# Patient Record
Sex: Female | Born: 1975 | Race: White | Hispanic: No | Smoking: Never smoker
Health system: Southern US, Community
[De-identification: ages and names within clinical notes are randomized; demographics above are authoritative.]

## PROBLEM LIST (undated history)

## (undated) DIAGNOSIS — F329 Major depressive disorder, single episode, unspecified: Secondary | ICD-10-CM

## (undated) DIAGNOSIS — G4730 Sleep apnea, unspecified: Secondary | ICD-10-CM

## (undated) DIAGNOSIS — K219 Gastro-esophageal reflux disease without esophagitis: Secondary | ICD-10-CM

## (undated) DIAGNOSIS — IMO0002 Reserved for concepts with insufficient information to code with codable children: Secondary | ICD-10-CM

## (undated) DIAGNOSIS — F32A Depression, unspecified: Secondary | ICD-10-CM

## (undated) DIAGNOSIS — IMO0001 Reserved for inherently not codable concepts without codable children: Secondary | ICD-10-CM

## (undated) DIAGNOSIS — F319 Bipolar disorder, unspecified: Secondary | ICD-10-CM

## (undated) DIAGNOSIS — L98499 Non-pressure chronic ulcer of skin of other sites with unspecified severity: Secondary | ICD-10-CM

## (undated) HISTORY — PX: DILATION AND CURETTAGE OF UTERUS: SHX78

## (undated) HISTORY — PX: KIDNEY STONE SURGERY: SHX686

## (undated) HISTORY — PX: TUBAL LIGATION: SHX77

---

## 1998-12-11 ENCOUNTER — Emergency Department (HOSPITAL_COMMUNITY): Admission: EM | Admit: 1998-12-11 | Discharge: 1998-12-12 | Payer: Self-pay | Admitting: Emergency Medicine

## 1999-01-07 ENCOUNTER — Emergency Department (HOSPITAL_COMMUNITY): Admission: EM | Admit: 1999-01-07 | Discharge: 1999-01-07 | Payer: Self-pay | Admitting: Emergency Medicine

## 1999-01-07 ENCOUNTER — Encounter: Payer: Self-pay | Admitting: Emergency Medicine

## 1999-01-07 ENCOUNTER — Inpatient Hospital Stay (HOSPITAL_COMMUNITY): Admission: EM | Admit: 1999-01-07 | Discharge: 1999-01-08 | Payer: Self-pay | Admitting: Emergency Medicine

## 1999-01-29 ENCOUNTER — Emergency Department (HOSPITAL_COMMUNITY): Admission: EM | Admit: 1999-01-29 | Discharge: 1999-01-29 | Payer: Self-pay

## 1999-01-30 ENCOUNTER — Inpatient Hospital Stay (HOSPITAL_COMMUNITY): Admission: AD | Admit: 1999-01-30 | Discharge: 1999-01-30 | Payer: Self-pay | Admitting: Obstetrics and Gynecology

## 1999-07-05 ENCOUNTER — Other Ambulatory Visit: Admission: RE | Admit: 1999-07-05 | Discharge: 1999-07-05 | Payer: Self-pay | Admitting: *Deleted

## 1999-09-17 ENCOUNTER — Emergency Department (HOSPITAL_COMMUNITY): Admission: EM | Admit: 1999-09-17 | Discharge: 1999-09-17 | Payer: Self-pay | Admitting: Emergency Medicine

## 1999-11-24 ENCOUNTER — Emergency Department (HOSPITAL_COMMUNITY): Admission: EM | Admit: 1999-11-24 | Discharge: 1999-11-24 | Payer: Self-pay | Admitting: Emergency Medicine

## 2000-01-21 ENCOUNTER — Other Ambulatory Visit: Admission: RE | Admit: 2000-01-21 | Discharge: 2000-01-21 | Payer: Self-pay | Admitting: *Deleted

## 2000-02-14 ENCOUNTER — Ambulatory Visit (HOSPITAL_COMMUNITY): Admission: AD | Admit: 2000-02-14 | Discharge: 2000-02-14 | Payer: Self-pay | Admitting: Obstetrics and Gynecology

## 2000-02-14 ENCOUNTER — Encounter: Payer: Self-pay | Admitting: Obstetrics and Gynecology

## 2000-02-14 ENCOUNTER — Encounter (INDEPENDENT_AMBULATORY_CARE_PROVIDER_SITE_OTHER): Payer: Self-pay

## 2000-11-25 ENCOUNTER — Emergency Department (HOSPITAL_COMMUNITY): Admission: EM | Admit: 2000-11-25 | Discharge: 2000-11-25 | Payer: Self-pay

## 2001-02-24 ENCOUNTER — Encounter: Payer: Self-pay | Admitting: Emergency Medicine

## 2001-02-24 ENCOUNTER — Observation Stay (HOSPITAL_COMMUNITY): Admission: AD | Admit: 2001-02-24 | Discharge: 2001-02-25 | Payer: Self-pay | Admitting: Obstetrics and Gynecology

## 2001-02-25 ENCOUNTER — Encounter: Payer: Self-pay | Admitting: Obstetrics and Gynecology

## 2001-03-11 ENCOUNTER — Encounter: Payer: Self-pay | Admitting: Obstetrics and Gynecology

## 2001-03-11 ENCOUNTER — Ambulatory Visit (HOSPITAL_COMMUNITY): Admission: RE | Admit: 2001-03-11 | Discharge: 2001-03-11 | Payer: Self-pay | Admitting: Obstetrics and Gynecology

## 2001-03-13 ENCOUNTER — Ambulatory Visit (HOSPITAL_COMMUNITY): Admission: RE | Admit: 2001-03-13 | Discharge: 2001-03-13 | Payer: Self-pay | Admitting: Obstetrics and Gynecology

## 2001-03-13 ENCOUNTER — Encounter (INDEPENDENT_AMBULATORY_CARE_PROVIDER_SITE_OTHER): Payer: Self-pay | Admitting: Specialist

## 2001-08-28 ENCOUNTER — Emergency Department (HOSPITAL_COMMUNITY): Admission: EM | Admit: 2001-08-28 | Discharge: 2001-08-28 | Payer: Self-pay

## 2008-12-01 ENCOUNTER — Emergency Department (HOSPITAL_COMMUNITY): Admission: EM | Admit: 2008-12-01 | Discharge: 2008-12-01 | Payer: Self-pay | Admitting: Emergency Medicine

## 2010-05-06 ENCOUNTER — Emergency Department (HOSPITAL_COMMUNITY)
Admission: EM | Admit: 2010-05-06 | Discharge: 2010-05-06 | Payer: Self-pay | Source: Home / Self Care | Admitting: Emergency Medicine

## 2010-07-22 LAB — COMPREHENSIVE METABOLIC PANEL
AST: 20 U/L (ref 0–37)
Albumin: 3.5 g/dL (ref 3.5–5.2)
Alkaline Phosphatase: 53 U/L (ref 39–117)
Chloride: 108 mEq/L (ref 96–112)
GFR calc Af Amer: >60 mL/min (ref >60)
Potassium: 3.9 mEq/L (ref 3.5–5.1)
Sodium: 138 mEq/L (ref 135–145)
Total Bilirubin: 0.3 mg/dL (ref 0.3–1.2)
Total Protein: 6.7 g/dL (ref 6.0–8.3)

## 2010-07-22 LAB — URINALYSIS, ROUTINE W REFLEX MICROSCOPIC
Hgb urine dipstick: NEGATIVE
Nitrite: NEGATIVE
Protein, ur: NEGATIVE mg/dL
Specific Gravity, Urine: 1.017 (ref 1.005–1.030)
Urobilinogen, UA: 0.2 mg/dL (ref 0.0–1.0)

## 2010-07-22 LAB — CBC
Platelets: 225 10*3/uL (ref 150–400)
RBC: 4.56 MIL/uL (ref 3.87–5.11)
RDW: 13.7 % (ref 11.5–15.5)
WBC: 14.1 10*3/uL — ABNORMAL HIGH (ref 4.0–10.5)

## 2010-07-22 LAB — DIFFERENTIAL
Basophils Absolute: 0 10*3/uL (ref 0.0–0.1)
Basophils Relative: 0 % (ref 0–1)
Eosinophils Relative: 1 % (ref 0–5)
Monocytes Absolute: 1.1 10*3/uL — ABNORMAL HIGH (ref 0.1–1.0)
Monocytes Relative: 8 % (ref 3–12)
Neutro Abs: 11.1 10*3/uL — ABNORMAL HIGH (ref 1.7–7.7)

## 2010-08-18 LAB — DIFFERENTIAL
Basophils Absolute: 0.2 10*3/uL — ABNORMAL HIGH (ref 0.0–0.1)
Lymphocytes Relative: 5 % — ABNORMAL LOW (ref 12–46)
Lymphs Abs: 0.7 10*3/uL (ref 0.7–4.0)
Neutro Abs: 10.6 10*3/uL — ABNORMAL HIGH (ref 1.7–7.7)

## 2010-08-18 LAB — URINALYSIS, ROUTINE W REFLEX MICROSCOPIC
Hgb urine dipstick: NEGATIVE
Leukocytes, UA: NEGATIVE
Nitrite: NEGATIVE
Protein, ur: 30 mg/dL — AB
Specific Gravity, Urine: 1.027 (ref 1.005–1.030)
Urobilinogen, UA: 1 mg/dL (ref 0.0–1.0)

## 2010-08-18 LAB — CBC
HCT: 42 % (ref 36.0–46.0)
MCHC: 33.4 g/dL (ref 30.0–36.0)
MCV: 82.1 fL (ref 78.0–100.0)
Platelets: 196 10*3/uL (ref 150–400)
RBC: 5.12 MIL/uL — ABNORMAL HIGH (ref 3.87–5.11)
WBC: 13 10*3/uL — ABNORMAL HIGH (ref 4.0–10.5)

## 2010-08-18 LAB — COMPREHENSIVE METABOLIC PANEL
BUN: 16 mg/dL (ref 6–23)
CO2: 23 mEq/L (ref 19–32)
Calcium: 9.1 mg/dL (ref 8.4–10.5)
Chloride: 104 mEq/L (ref 96–112)
Creatinine, Ser: 0.79 mg/dL (ref 0.4–1.2)
GFR calc Af Amer: >60 mL/min (ref >60)
GFR calc non Af Amer: >60 mL/min (ref >60)
Total Bilirubin: 0.7 mg/dL (ref 0.3–1.2)

## 2010-08-18 LAB — URINE MICROSCOPIC-ADD ON

## 2010-08-18 LAB — PREGNANCY, URINE: Preg Test, Ur: NEGATIVE

## 2010-08-18 LAB — LIPASE, BLOOD: Lipase: 14 U/L (ref 11–59)

## 2010-09-27 NOTE — H&P (Signed)
[facility]  Patient:    Barbara Andersen, Barbara Andersen Visit Number: [id number] MRN: [id number]          Service Type: OBS Location: 9300 [id number] Attending Physician:  Leonard Schwartz Dictated by:   Janine Limbo, M.D. Admit Date:  02/24/2001                           History and Physical  HISTORY OF PRESENT ILLNESS:   Barbara Andersen is a 35 year old female, gravida 3, para 0-0-2-0, who presented to the emergency department at [facility] on February 24, 2001 complaining of abdominal pain.  She had a four-hour period on February 23, 2001.  She has had two prior miscarriages. She had a D&C in 2001 because of a miscarriage.  The patient reports that she has had nausea.  She also has had diarrhea 30 minutes after meals for the past two months.  She has otherwise been without complaints.  She denies fever, vomiting and GU complaints.  OBSTETRICAL HISTORY:          The patient had a miscarriage in 2000 and another miscarriage in 2001.  She had a D&C following her miscarriage in 2001.  PAST MEDICAL HISTORY:         The patient denies hypertension and diabetes.  DRUG ALLERGIES:               None known.  SOCIAL HISTORY:               The patient denies cigarette use, alcohol use and recreational drug use.  REVIEW OF SYSTEMS:            Please see history of present illness.  FAMILY HISTORY:               The patients father had hypertension.  PHYSICAL EXAMINATION:  VITAL SIGNS:                  Temperature is 97.2.  Respirations 20.  Pulse is 100.  Blood pressure is 114/74.  HEENT:                        Within normal limits.  CHEST:                        Clear.  HEART:                        Regular rate and rhythm.  ABDOMEN:                      Soft and nontender.  EXTREMITIES:                  Within normal limits.  NEUROLOGIC:                   Grossly normal.  PELVIC:                       External genitalia are normal.  The  vagina is nontender.  No blood is present.  The cervix is closed and nontender.  Uterus is normal size, shape and consistency, nontender.  Adnexa:  No masses.  LABORATORY VALUES:            Blood type is B-positive.  Quantitative hCG is 15,595.  An ultrasound  was performed that showed a 7 weeks missed abortion.  There was no blood flow to the left adnexa and there were small cysts present.  There was a question of torsion of the left ovary.  ASSESSMENT:                   1. Seven weeks gestation.                               2. Missed abortion.                               3. Questionable torsion of the left ovary.  PLAN:                         A long discussion was held with the patient and her mother.  We discussed our options for management which included taking the patient to the operating room tonight and performing a dilatation and evacuation.  We would also perform a diagnostic laparoscopy to document the status of the left ovary.  We also discussed the option of observation tonight with repeat ultrasound tomorrow to see if we felt that a laparoscopy was entirely necessary.  We discussed observation in the hospital but we also discussed having the procedures performed as an outpatient.  We discussed the benefits and risks of each of those options including a risk that if there was indeed a torsion to the left ovary that we would still need to operate on her later on tonight and that there still may be some permanent damage to the left ovary which may result in the need for a left oophorectomy.  The patient considered all of the options and she has elected to proceed to [facility] for observation tonight and then repeat ultrasound in the morning in hopes of avoiding laparoscopy. Dictated by:   Janine Limbo, M.D. Attending Physician:  Leonard Schwartz DD:  02/24/01 TD:  02/25/01 Job: [id number]

## 2010-09-27 NOTE — H&P (Signed)
[facility]  Patient:    MADHAVI, HAMBLEN Visit Number: [id number] MRN: [id number]          Service Type: EMS Location: ED Attending Physician:  Hanley Seamen Dictated by:   Janine Limbo, M.D. Admit Date:  02/24/2001 Discharge Date: 02/24/2001                           History and Physical  HISTORY OF PRESENT ILLNESS:   [name] is a 35 year old female, gravida 3, para 0-0-2-0, who presents for dilatation and evacuation because of a first-trinester missed abortion.  Ultrasound was obtained and confirmed the above.  OBSTETRICAL HISTORY:          The patient had a miscarriage in 2000 and another miscarriage in 2001.  She had a D&C following her miscarriage in 2001.   PAST MEDICAL HISTORY:         The patient denies hypertension and diabetes.  DRUG ALLERGIES:               None known.  SOCIAL HISTORY:               The patient denies cigarette use, alcohol use, and recreational drug use.  REVIEW OF SYSTEMS:            Noncontributory.  FAMILY HISTORY:               The patients father had hypertension.  PHYSICAL EXAMINATION:  VITAL SIGNS:                  Weight is 258 pounds.  HEENT:                        Within normal limits.  CHEST:                        Clear.  HEART:                        Regular rate and rhythm.  BREASTS:                      Without masses.  ABDOMEN:                      Nontender.  EXTREMITIES:                  Within normal limits.  NEUROLOGIC:                   Exam is grossly normal.  PELVIC:                       Cervix is closed and long.  Uterus is normal size, shape, and consistency.  Adnexa - no masses.  LABORATORY VALUES:            Blood type is B positive.  HCG on February 24, 2001, was 15,595.  ASSESSMENT:                   First-trimester missed abortion.  PLAN:                         The patient will undergo a suction, dilatation, and evacuation.  The patient understands  the indications for her procedure and she  accepts the risks of, but not limited to, anesthetic complications, bleeding, infections, and possible damage to the surrounding organs. Dictated by:   Janine Limbo, M.D. Attending Physician:  Hanley Seamen DD:  03/12/01 TD:  03/12/01 Job: [id number]

## 2010-09-27 NOTE — Op Note (Signed)
[facility]  Patient:    Barbara, Andersen Visit Number: [id number] MRN: [id number]          Service Type: DSU Location: [facility] Attending Physician:  Leonard Schwartz Proc. Date: 03/13/01 Admit Date:  03/13/2001 Discharge Date: 03/13/2001                             Operative Report  PREOPERATIVE DIAGNOSIS:       First trimester missed abortion.  POSTOPERATIVE DIAGNOSIS:      First trimester missed abortion.  OPERATION:                    Dilatation and evacuation.  SURGEON:                      Janine Limbo, M.D.  ASSISTANT:  ANESTHESIA:                   Monitored anesthesia care and paracervical block using 0.5% Marcaine.  ESTIMATED BLOOD LOSS:  INDICATIONS:                  [name] is a 35 year old female, gravida 3, para 0-0-2-0, who presents with a missed abortion in the first trimester that was confirmed by ultrasound.  The patient understands the indications for her procedure and she accepts the risks of, but not limited to, anesthetic complications, bleeding, infections, and possible damage to the surrounding organs.  FINDINGS:                     The patients blood type is B positive.  A small amount of products of conception were removed from within the uterine cavity.  DESCRIPTION OF PROCEDURE:     The patient was taken to the operating room where she was given medication through her IV line.  The perineum and vagina were prepped with multiple layers of Betadine.  The bladder was drained of urine. Examination under anesthesia was performed.  The patient was sterilely draped.  A paracervical block was placed using 10 cc of 0.5% Marcaine. The uterus was sounded to 11 cm.  The cervix was gradually dilated.  The uterine cavity was evacuated using a #10 suction curet and then a medium sharp curet. The cavity was felt to be clean at the end of our procedure.  The postoperative uterus was firm.  Sponge, needle, and  instrument counts were correct x 2 occasions.  The estimated blood loss was 20 cc.  The patient tolerated the procedure well.  She was taken to the recovery room in stable condition.  FOLLOW-UP:                    The patient was given prescription for Vicodin one to two every four hours as needed for pain 20 tablets no refills.  She was given a copy of the postoperative instruction sheet as prepared by [facility] for patients who have undergone a D&C.  She will return to see Dr. Stefano Gaul in two to three weeks for follow-up examination.  She will call for questions or concerns. Attending Physician:  Leonard Schwartz DD:  03/13/01 TD:  03/15/01 Job: [id number]

## 2010-09-27 NOTE — H&P (Signed)
[facility]  Patient:    Barbara Andersen, Barbara Andersen                     MRN: [id number] Adm. Date:  [id number] Attending:  Shaune Spittle Dictator:   Nigel Bridgeman, C.N.M.                         History and Physical  REDICTATION  HISTORY OF PRESENT ILLNESS:   Ms. Barbara Andersen is a 35 year old, gravida 2, para 0-0-1-0 who presented to maternity admissions today with known SAB and onset of heavy bleeding and cramping. The patient was seen on September 25 for a routine OB visit. At that time, no fetal heart tones were noted at approximately 10-[redacted] week gestation. Ultrasound was done that showed a 6 week, 3 day empty gestational sac. Quantitative hcg at that time was approximately 10,000. Subsequent quantitative 2 days later was approximately 7800 and on October 4 quantitative hcg was 2058. The patient denies passing any products of conception on the way to the hospital.  PRENATAL LABS:                Blood type is B+ Rh antibody negative. VDRL nonreactive. Rubella titer positive. Hepatitis B surface antigen negative. GC and chlamydia cultures were negative in the first trimester. Hemoglobin today is 13.9, hematocrit of 40.3, wbc count of 12.2 and platelets of 232.  OBSTETRICAL HISTORY:          The patient entered care at approximately [redacted] week gestation. She had had a small amount of spotting but findings were normal. She was seen 2 weeks later for a fetal heart rate check secondary to inability to hear it at the first visit with large amount of adipose tissue. Ultrasound was done at that time which documented the empty gestational sac. The patient elected to follow this expectantly and her quants were falling appropriately until the onset of her bleeding and cramping today.  GYNECOLOGIC HISTORY:          Her last menstrual period was November 18, 1999. She had a history of ovarian cysts.  MEDICAL HISTORY:              She does have a history of obesity and  large body habitus with height of 5 foot 10 and weight of 245. She has had no previous surgeries or hospitalizations. She is allergic to seafood but has no known medication allergies.  FAMILY HISTORY:               Unremarkable.  SOCIAL HISTORY:               The patient is single, the partner is involved. The patient is employed at [employer]. She denies any alcohol, drug or tobacco use during this pregnancy.  PHYSICAL EXAMINATION:  VITAL SIGNS:                  Stable. The patient is afebrile.  HEENT:                        Within normal limits.  LUNGS:                        Bilateral breath sounds are clear.  HEART:                        Regular rate  and rhythm without murmur.  BREASTS:                      Soft and nontender.  ABDOMEN:                      Soft, slightly tender in the lower segment. Positive bowel sounds are noted.  PELVIC:                       There is a moderate amount of dark blood and tissue in the vault. There was a moderate amount of gestational tissue at the cervical os that was removed during the exam. Subsequently her bleeding has been minimal. The cervix was slightly tender to palpation. The uterus was slightly tender as well.  EXTREMITIES:                  Deep tendon reflexes were 2+ without clonus. There is a trace edema noted.  Ultrasound in maternity admissions today revealed probable retained products of conception.  IMPRESSION:                   1. Incomplete SAB.                               2. Rh positive.  PLAN:                         1. Consulted with Dr. Pennie Rushing regarding patient                                 management. Dr. Pennie Rushing did see the patient                               and repeated a pelvic exam prior to the                         patient going to surgery.                               2. Dr. Pennie Rushing believes that D&E is indicated.                                The patient consented to this   procedure.                               3. The patient will be taken to the operating                               room at direction of timing regarding the OR. DD: 02/14/00 TD:  02/14/00 Job: [id number]

## 2010-09-27 NOTE — H&P (Signed)
[facility]  Patient:    Barbara Andersen, Barbara Andersen                     MRN: [id number] Adm. Date:  [id number] Attending:  Shaune Spittle Dictator:   Nigel Bridgeman, C.N.M.                         History and Physical  HISTORY OF PRESENT ILLNESS:       Ms. Barbara Andersen is a 35 year old gravida 2, para 0-0-1-0 who presented to maternity admissions today with onset of heavy vaginal bleeding and severe cramping. Her history has been remarkable for documented empty gestational sac noted by ultrasound on September 25. She has been followed with gradually decreasing quantitative hcg levels. Quantitative hcg on September 25 was 10,911.6 repeat was 7825.7 on September 27 and 2058.7 on October 4. She had had sporadic small amount of bleeding since the diagnosis of SAB but this morning was the first onset of heavy bleeding. When she presented to maternity admission, she had not passed any products of conception but on evaluation by pelvic exam, she was found to have a moderate amount of products of conception at the cervical os. This was removed. The patients cramping and bleeding did decrease subsequent to this. She was sent for ultrasound and a finding consistent with retained products of conception was noted on ultrasound therefore the patient had already been seen by Dr. Pennie Rushing and the plan was made to proceed with B&E for retained products. The patients bleeding was minimal and her cramping was also minimal at that time.  MEDICAL HISTORY:              Remarkable for obesity and large body habitus with height of 5 foot 10 and weight of 245.  GYNECOLOGICAL HISTORY:        Her last menstrual period was November 12, 1999. Her periods have been normal. She does have a history of ovarian cysts in the past.  PAST SURGICAL HISTORY:        None.  PRENATAL COURSE:              The patient entered care at approximately 10 weeks.  Due to large body habitus, there was  inability to hear fetal heart tones. The patient was seen approximately 1 to 2 weeks later for reevaluation. At that time, still no fetal heart tones were noted and an ultrasound was done at that time that documented a 6 week 3 day empty gestational sac.  PRENATAL LABS:                Blood type is B+ Rh antibody negative. VDRL nonreactive. Rubella titer positive. Hepatitis B surface antigen negative. Hemoglobin today was 13.9, wbc was 12.2, hematocrit 40.3, platelets were 232.  PHYSICAL EXAMINATION:  VITAL SIGNS:                  Stable. The patient is afebrile.  HEENT:                        Within normal limits.  LUNGS:                        Clear.  HEART:                        Regular rate and rhythm without murmur.  BREASTS:                      Soft and nontender.  ABDOMEN:                      Fundal height approximately [redacted] week gestational size but difficult to assess secondary to adipose tissue. The uterus is slightly tender to palpation.  PELVIC:                       On admission, there was a moderate amount of bleeding noted. There were products of conception noted at the os. These were removed and bleeding did decrease subsequently. Bleeding is now a small amount. The pelvic exam was verified by Dr. Pennie Rushing.  EXTREMITIES:                  Deep tendon reflexes are 2+ without clonus. There is a trace edema noted.  IMPRESSION:                   1. Incomplete AB.                               2. Rh positive blood type.                               3. Stable in light of bleeding and pain at this                                  time.  PLAN:                         1. Consulted with Dr. Pennie Rushing as attending                                  physician.                               2. Plan made for dilatation and evacuation.                               3. Risks and benefits were reviewed for the                             procedure with Dr. Pennie Rushing and the  patient.                           The patient did consent to this.                               4. Anticipate discharge after D&E. DD:  02/14/00 TD:  02/14/00 Job: [id number]

## 2010-09-27 NOTE — Op Note (Signed)
[facility]  Patient:    Barbara Andersen, Barbara Andersen                     MRN: [id number] Proc. Date: 02/14/00 Adm. Date:  [id number] Attending:  Dierdre Forth Pearline                           Operative Report  PREOPERATIVE DIAGNOSIS:       Incomplete spontaneous abortion.  POSTOPERATIVE DIAGNOSIS:      Incomplete spontaneous abortion.  OPERATION:                    Suction, dilatation, and evacuation.  ANESTHESIA:                   Monitored anesthesia care and local.  ESTIMATED BLOOD LOSS:         Less than 50 cc.  COMPLICATIONS:                None.  FINDING:                      A small amount of products of conception were obtained on D&E.  DESCRIPTION OF PROCEDURE:     The patient had been seen in maternity admissions area this morning, presenting complaining of increasing abdominal pain with what was known to be a missed abortion.  The patient, during her evaluation in MAU, passed a large amount of tissue, and her bleeding decreased significantly as did her pain.  An ultrasound was performed which showed remaining products of conception.  She was brought to the operating room after discussion was held with her and her partner concerning the risks of anesthesia, bleeding, infection, and perforation of the uterus.  They wished to proceed with D&E.  The patient was taken to the operating room and placed on the operating table.  She was placed in the modified lithotomy position. Perineum and vagina were prepped with multiple layers of Hibiclens and draped. The speculum was placed in the vagina and a single-tooth tenaculum placed on the cervix.  A paracervical block was achieved with a total of 10 cc of 2% Xylocaine.  The cervix was noted to be opening up to accommodate a #8 suction curette after passing several dilators.  The suction curette was used to evacuate all tissue from the uterus and a sharp curette used to ensure that all tissue had been  removed.  Hemostasis was noted to be adequate.  All instruments were removed from the vagina and the patient taken from the operating room to the recovery room in satisfactory condition, having tolerated the procedure well with sponge and instrument counts correct.  DISCHARGE INSTRUCTIONS:       Printed instructions from [facility].  DISCHARGE MEDICATIONS:        1. Doxycycline 100 mg p.o. b.i.d. for 7 days.                               2. Methergine 0.2 mg p.o. q.i.d. x 2 days.                               3. Ibuprofen 600 mg p.o. q.6h. p.r.n. pain.  FOLLOW-UP:  The patient is to follow up in the office in two weeks. DD:  02/14/00 TD:  02/15/00 Job: [id number]

## 2011-03-11 ENCOUNTER — Emergency Department (HOSPITAL_COMMUNITY)
Admission: EM | Admit: 2011-03-11 | Discharge: 2011-03-11 | Disposition: A | Discharge status: Home / Self Care | Payer: Medicaid - Out of State | Attending: Emergency Medicine | Admitting: Emergency Medicine

## 2011-03-11 DIAGNOSIS — M549 Dorsalgia, unspecified: Secondary | ICD-10-CM | POA: Insufficient documentation

## 2011-03-11 DIAGNOSIS — Z79899 Other long term (current) drug therapy: Secondary | ICD-10-CM | POA: Insufficient documentation

## 2011-03-16 ENCOUNTER — Encounter: Payer: Self-pay | Admitting: *Deleted

## 2011-03-16 ENCOUNTER — Emergency Department (HOSPITAL_COMMUNITY)
Admission: EM | Admit: 2011-03-16 | Discharge: 2011-03-16 | Disposition: A | Discharge status: Home / Self Care | Payer: Medicaid - Out of State | Attending: Emergency Medicine | Admitting: Emergency Medicine

## 2011-03-16 ENCOUNTER — Emergency Department (HOSPITAL_COMMUNITY): Payer: Medicaid - Out of State

## 2011-03-16 DIAGNOSIS — M545 Low back pain, unspecified: Secondary | ICD-10-CM | POA: Insufficient documentation

## 2011-03-16 DIAGNOSIS — M549 Dorsalgia, unspecified: Secondary | ICD-10-CM

## 2011-03-16 DIAGNOSIS — R209 Unspecified disturbances of skin sensation: Secondary | ICD-10-CM | POA: Insufficient documentation

## 2011-03-16 DIAGNOSIS — IMO0002 Reserved for concepts with insufficient information to code with codable children: Secondary | ICD-10-CM | POA: Insufficient documentation

## 2011-03-16 DIAGNOSIS — M79609 Pain in unspecified limb: Secondary | ICD-10-CM | POA: Insufficient documentation

## 2011-03-16 DIAGNOSIS — K219 Gastro-esophageal reflux disease without esophagitis: Secondary | ICD-10-CM | POA: Insufficient documentation

## 2011-03-16 DIAGNOSIS — F3130 Bipolar disorder, current episode depressed, mild or moderate severity, unspecified: Secondary | ICD-10-CM | POA: Insufficient documentation

## 2011-03-16 DIAGNOSIS — Z79899 Other long term (current) drug therapy: Secondary | ICD-10-CM | POA: Insufficient documentation

## 2011-03-16 HISTORY — DX: Reserved for inherently not codable concepts without codable children: IMO0001

## 2011-03-16 HISTORY — DX: Bipolar disorder, unspecified: F31.9

## 2011-03-16 HISTORY — DX: Gastro-esophageal reflux disease without esophagitis: K21.9

## 2011-03-16 HISTORY — DX: Reserved for concepts with insufficient information to code with codable children: IMO0002

## 2011-03-16 HISTORY — DX: Depression, unspecified: F32.A

## 2011-03-16 HISTORY — DX: Major depressive disorder, single episode, unspecified: F32.9

## 2011-03-16 HISTORY — DX: Sleep apnea, unspecified: G47.30

## 2011-03-16 HISTORY — DX: Non-pressure chronic ulcer of skin of other sites with unspecified severity: L98.499

## 2011-03-16 MED ORDER — HYDROMORPHONE HCL PF 1 MG/ML IJ SOLN
INTRAMUSCULAR | Status: AC
  Administered 2011-03-16: 18:00:00
  Filled 2011-03-16: qty 1

## 2011-03-16 MED ORDER — IBUPROFEN 800 MG PO TABS: 800.0000 mg | ORAL_TABLET | Freq: Three times a day (TID) | ORAL | Status: DC

## 2011-03-16 MED ORDER — HYDROMORPHONE HCL PF 1 MG/ML IJ SOLN
INTRAMUSCULAR | Status: AC
  Filled 2011-03-16: qty 1

## 2011-03-16 MED ORDER — HYDROMORPHONE HCL PF 1 MG/ML IJ SOLN
1.0000 mg | Freq: Once | INTRAMUSCULAR | Status: AC
  Administered 2011-03-16: 1 mg via INTRAMUSCULAR

## 2011-03-16 MED ORDER — OXYCODONE-ACETAMINOPHEN 5-325 MG PO TABS
1.0000 | ORAL_TABLET | ORAL | Status: AC | PRN
Start: 2011-03-16 — End: 2011-03-26

## 2011-03-16 MED ORDER — KETOROLAC TROMETHAMINE 60 MG/2ML IM SOLN
INTRAMUSCULAR | Status: AC
  Filled 2011-03-16: qty 2

## 2011-03-16 MED ORDER — HYDROMORPHONE HCL PF 1 MG/ML IJ SOLN: 1.0000 mg | Freq: Once | INTRAMUSCULAR | Status: DC

## 2011-03-16 MED ORDER — IBUPROFEN 800 MG PO TABS: 800.0000 mg | ORAL_TABLET | Freq: Three times a day (TID) | ORAL | Status: AC

## 2011-03-16 MED ORDER — KETOROLAC TROMETHAMINE 60 MG/2ML IM SOLN
60.0000 mg | Freq: Once | INTRAMUSCULAR | Status: AC
  Administered 2011-03-16: 60 mg via INTRAMUSCULAR

## 2011-03-16 NOTE — ED Notes (Signed)
This nurse covering Kim Kramm, RN for lunch. 

## 2011-03-16 NOTE — ED Provider Notes (Signed)
History     CSN: [id number] Arrival date & time: 03/16/2011 12:49 PM   First MD Initiated Contact with Patient 03/16/11 1356      Chief Complaint  Patient presents with  . Leg Pain    (Consider location/radiation/quality/duration/timing/severity/associated sxs/prior treatment) HPI History provided by patient.  Pt has had pain mid-line low back for the past 2 months.  Has worsened in last 2 weeks.  Radiates down both legs to the feet and associated w/ tingling.  Pain aggravated by bending, walking and hitting bumps while driving.  Denies fever, urinary symptoms, bowel retention and LE weakness.  No prior h/o back pain.  Per prior chart, pt seen for same in ED on 03/11/11.    Past Medical History  Diagnosis Date  . Bipolar 1 disorder   . Depression   . GERD (gastroesophageal reflux disease)   . Sleep apnea   . Disc degeneration   . Ulcer disease     Past Surgical History  Procedure Date  . Dilation and curettage of uterus   . Tubal ligation     No family history on file.  History  Substance Use Topics  . Smoking status: Never Smoker   . Smokeless tobacco: Not on file  . Alcohol Use: No    OB History    Grav Para Term Preterm Abortions TAB SAB Ect Mult Living                  Review of Systems  Allergies  Review of patient's allergies indicates no known allergies.  Home Medications   Current Outpatient Rx  Name Route Sig Dispense Refill  . AMPHETAMINE-DEXTROAMPHETAMINE 20 MG PO TABS Oral Take 20 mg by mouth 2 (two) times daily.      Marland Kitchen GABAPENTIN 100 MG PO CAPS Oral Take 100 mg by mouth 2 (two) times daily.      Carma Leaven M PLUS PO TABS Oral Take 1 tablet by mouth daily.      Marland Kitchen NAPROXEN 500 MG PO TABS Oral Take 500 mg by mouth daily.      . SERTRALINE HCL 25 MG PO TABS Oral Take 25 mg by mouth daily.        BP 124/76  Pulse 73  Temp(Src) 98.8 F (37.1 C) (Oral)  Resp 20  SpO2 98%  Physical Exam  ED Course  Procedures (including critical care  time)  Labs Reviewed - No data to display No results found.   No diagnosis found.    MDM  Medical screening examination/treatment/procedure(s) were performed by non-physician practitioner and as supervising physician I was immediately available for consultation/collaboration.        Jasmine Awe, MD 03/16/11 1537

## 2011-03-16 NOTE — ED Notes (Signed)
Pt reports having internal hemorrhoids since birth of child 7 yrs ago. States they have gotten worse. Has noticed blood in stool from them. Pt also reports having numbness and sharp, shooting pain down both legs, right greater than left for 2-3 wks. Pt does not have a PCP, and came to ER for further eval. Daughter at the bedside

## 2011-03-16 NOTE — ED Notes (Signed)
Pt states she is having pain in her butt with pain going down legs, pain increased over the last 2  Weeks, c/o hemorrhoids as well

## 2012-11-01 ENCOUNTER — Encounter (HOSPITAL_COMMUNITY): Payer: Self-pay | Admitting: Emergency Medicine

## 2012-11-01 ENCOUNTER — Emergency Department (HOSPITAL_COMMUNITY): Payer: Medicaid Other

## 2012-11-01 ENCOUNTER — Emergency Department (HOSPITAL_COMMUNITY)
Admission: EM | Admit: 2012-11-01 | Discharge: 2012-11-01 | Disposition: A | Discharge status: Home / Self Care | Payer: Medicaid Other | Attending: Emergency Medicine | Admitting: Emergency Medicine

## 2012-11-01 DIAGNOSIS — S99919A Unspecified injury of unspecified ankle, initial encounter: Secondary | ICD-10-CM | POA: Insufficient documentation

## 2012-11-01 DIAGNOSIS — S82899A Other fracture of unspecified lower leg, initial encounter for closed fracture: Secondary | ICD-10-CM | POA: Insufficient documentation

## 2012-11-01 DIAGNOSIS — W1692XA Jumping or diving into unspecified water causing other injury, initial encounter: Secondary | ICD-10-CM | POA: Insufficient documentation

## 2012-11-01 DIAGNOSIS — S8990XA Unspecified injury of unspecified lower leg, initial encounter: Secondary | ICD-10-CM | POA: Insufficient documentation

## 2012-11-01 DIAGNOSIS — E669 Obesity, unspecified: Secondary | ICD-10-CM | POA: Insufficient documentation

## 2012-11-01 DIAGNOSIS — S99912A Unspecified injury of left ankle, initial encounter: Secondary | ICD-10-CM

## 2012-11-01 DIAGNOSIS — K219 Gastro-esophageal reflux disease without esophagitis: Secondary | ICD-10-CM | POA: Insufficient documentation

## 2012-11-01 DIAGNOSIS — Z8669 Personal history of other diseases of the nervous system and sense organs: Secondary | ICD-10-CM | POA: Insufficient documentation

## 2012-11-01 DIAGNOSIS — Z79899 Other long term (current) drug therapy: Secondary | ICD-10-CM | POA: Insufficient documentation

## 2012-11-01 DIAGNOSIS — F319 Bipolar disorder, unspecified: Secondary | ICD-10-CM | POA: Insufficient documentation

## 2012-11-01 DIAGNOSIS — Z791 Long term (current) use of non-steroidal anti-inflammatories (NSAID): Secondary | ICD-10-CM | POA: Insufficient documentation

## 2012-11-01 DIAGNOSIS — Z8781 Personal history of (healed) traumatic fracture: Secondary | ICD-10-CM | POA: Insufficient documentation

## 2012-11-01 DIAGNOSIS — Y9339 Activity, other involving climbing, rappelling and jumping off: Secondary | ICD-10-CM | POA: Insufficient documentation

## 2012-11-01 DIAGNOSIS — S82839A Other fracture of upper and lower end of unspecified fibula, initial encounter for closed fracture: Secondary | ICD-10-CM

## 2012-11-01 DIAGNOSIS — Z8739 Personal history of other diseases of the musculoskeletal system and connective tissue: Secondary | ICD-10-CM | POA: Insufficient documentation

## 2012-11-01 DIAGNOSIS — Y9289 Other specified places as the place of occurrence of the external cause: Secondary | ICD-10-CM | POA: Insufficient documentation

## 2012-11-01 DIAGNOSIS — Z872 Personal history of diseases of the skin and subcutaneous tissue: Secondary | ICD-10-CM | POA: Insufficient documentation

## 2012-11-01 MED ORDER — HYDROMORPHONE HCL PF 1 MG/ML IJ SOLN
1.0000 mg | Freq: Once | INTRAMUSCULAR | Status: AC
  Administered 2012-11-01: 1 mg via INTRAVENOUS
  Filled 2012-11-01: qty 1

## 2012-11-01 MED ORDER — OXYCODONE-ACETAMINOPHEN 5-325 MG PO TABS: 1.0000 | ORAL_TABLET | Freq: Four times a day (QID) | ORAL | Status: DC | PRN

## 2012-11-01 NOTE — ED Notes (Signed)
[id number]<AV> Expected date:<BR> Expected time:<BR> Means of arrival:<BR> Comments:<BR> Pt still in room.

## 2012-11-01 NOTE — Progress Notes (Signed)
   CARE MANAGEMENT ED NOTE 11/01/2012  Patient:  Barbara Andersen,Barbara Andersen   Account Number:  [id number]  Date Initiated:  11/01/2012  Documentation initiated by:  Radford Pax  Subjective/Objective Assessment:   Patient presented to ED with swelling and pain to left ankle.     Subjective/Objective Assessment Detail:     Action/Plan:   Action/Plan Detail:   Anticipated DC Date:       Status Recommendation to Physician:   Result of Recommendation:    Other ED Services  Consult Working Plan    DC Planning Services  PCP issues    Choice offered to / List presented to:            Status of service:  Completed, signed off  ED Comments:   ED Comments Detail:  Patient listed as not having a pcp.  EDCM spoke to patient who stated she goes to [facility].  She stated that she cannot pronounce the name of her physician there.  EDCM also noted her Medicaid is out of state. Patient stated that she has since called DSS to correct that.  Patient used to live in [place].  No further needs at this time.

## 2012-11-01 NOTE — ED Notes (Signed)
Pt was at [place] and stepped into a pool and felt pain to lt ankle and now has swelling and pain not able to apply pressure. Pt has has fentayl pta 20g iv in places, strong pedal pulses.

## 2012-11-01 NOTE — ED Provider Notes (Signed)
History    CSN: [id number] Arrival date & time 11/01/12  1801  First MD Initiated Contact with Patient 11/01/12 1809     Chief Complaint  Patient presents with  . Ankle Pain   (Consider location/radiation/quality/duration/timing/severity/associated sxs/prior Treatment) HPI Comments: 37 year old female presents to the emergency department complaining of left ankle pain and swelling after jumping into a pool and landing on her left ankle. Since the incident she has been unable to apply any pressure to her ankle. Swelling beginning immediately. Pain initially 10 out of 10, decreased to a 7/10 after receiving 250 mcg fentanyl. Denies numbness or tingling.  Patient is a 37 y.o. female presenting with ankle pain. The history is provided by the patient.  Ankle Pain  Past Medical History  Diagnosis Date  . Bipolar 1 disorder   . Depression   . GERD (gastroesophageal reflux disease)   . Sleep apnea   . Disc degeneration   . Ulcer disease    Past Surgical History  Procedure Laterality Date  . Dilation and curettage of uterus    . Tubal ligation     No family history on file. History  Substance Use Topics  . Smoking status: Never Smoker   . Smokeless tobacco: Not on file  . Alcohol Use: No   OB History   Grav Para Term Preterm Abortions TAB SAB Ect Mult Living                 Review of Systems  Gastrointestinal: Negative for nausea and vomiting.  Musculoskeletal:       Positive for right ankle pain and swelling.  Neurological: Negative for dizziness and light-headedness.  All other systems reviewed and are negative.    Allergies  Bee venom and Fish allergy  Home Medications   Current Outpatient Rx  Name  Route  Sig  Dispense  Refill  . amphetamine-dextroamphetamine (ADDERALL) 20 MG tablet   Oral   Take 20 mg by mouth 2 (two) times daily.           . celecoxib (CELEBREX) 100 MG capsule   Oral   Take 100 mg by mouth every morning.         . gabapentin  (NEURONTIN) 100 MG capsule   Oral   Take 100 mg by mouth 3 (three) times daily.          . Multiple Vitamins-Minerals (MULTIVITAMINS THER. W/MINERALS) TABS   Oral   Take 1 tablet by mouth every morning.          Marland Kitchen omeprazole (PRILOSEC) 40 MG capsule   Oral   Take 40 mg by mouth every morning.         . sertraline (ZOLOFT) 50 MG tablet   Oral   Take 50 mg by mouth every morning.          BP 118/65  Pulse 94  Temp(Src) 97.8 F (36.6 C) (Oral)  Resp 20  SpO2 99%  LMP 10/25/2012 Physical Exam  Nursing note and vitals reviewed. Constitutional: She is oriented to person, place, and time. She appears well-developed. No distress.  Obese.  HENT:  Head: Normocephalic and atraumatic.  Mouth/Throat: Oropharynx is clear and moist.  Eyes: Conjunctivae are normal.  Neck: Normal range of motion. Neck supple.  Cardiovascular: Normal rate, regular rhythm, normal heart sounds and intact distal pulses.   Pulses:      Dorsalis pedis pulses are 2+ on the left side.       Posterior tibial pulses  are 2+ on the left side.  Pulmonary/Chest: Effort normal and breath sounds normal. No respiratory distress.  Musculoskeletal:  Tender to palpation throughout entire ankle joint, more so around distal fibula. Edema noted throughout the ankle. Decreased range of motion limited by pain. No deformity. No bruising or ecchymosis.  Neurological: She is alert and oriented to person, place, and time. No sensory deficit.  Skin: Skin is warm, dry and intact. She is not diaphoretic.  Psychiatric: She has a normal mood and affect. Her behavior is normal.    ED Course  Procedures (including critical care time) Labs Reviewed - No data to display Dg Ankle Complete Left  11/01/2012   *RADIOLOGY REPORT*  Clinical Data: Ankle pain.  Injury  LEFT ANKLE COMPLETE - 3+ VIEW  Comparison: None  Findings: Oblique fracture distal fibula with mild displacement. There is  lateral displacement of the talus relative to  the tibia compatible with medial ligament injury.  There is diffuse soft tissue swelling.  No fracture of the tibia.  There is spurring of the calcaneus.  IMPRESSION: Fracture of the distal fibula.  Medial ligament injury.   Original Report Authenticated By: Janeece Riggers, M.D.   1. Fracture of distal fibula   2. Ankle injury, left, initial encounter     MDM  Fracture of distal fibula, medial ligament injury. Neurovascularly intact. I spoke with Dr. Ophelia Charter, [facility] orthopedics who advised placing patient in splint, crutches, and f/u in his office tomorrow. Dilaudid for pain in ED. She is in NAD. Stable for discharge. Patient states understanding of plan and is agreeable.   Trevor Mace, PA-C 11/01/12 1906

## 2012-11-02 NOTE — ED Provider Notes (Signed)
Medical screening examination/treatment/procedure(s) were performed by non-physician practitioner and as supervising physician I was immediately available for consultation/collaboration.  Mckyle Solanki, MD 11/02/12 0111 

## 2012-11-03 ENCOUNTER — Other Ambulatory Visit (HOSPITAL_COMMUNITY): Payer: Self-pay | Admitting: Orthopaedic Surgery

## 2012-11-03 ENCOUNTER — Encounter (HOSPITAL_COMMUNITY): Payer: Self-pay | Admitting: Surgery

## 2012-11-03 MED ORDER — MUPIROCIN 2 % EX OINT
TOPICAL_OINTMENT | Freq: Two times a day (BID) | CUTANEOUS | Status: DC
  Administered 2012-11-04: 15:00:00 via NASAL
  Filled 2012-11-03 (×4): qty 22

## 2012-11-03 NOTE — Progress Notes (Signed)
Pre op call completed. Patient instructed to call short stay tomorrow morning at 0800. Patient verbalized understanding. Patient denied having any cardiac or pulmonary history but informed Nurse that she does have sleep apnea and is supposed to wear CPAP at bedtime, but after moving she had to return the CPAP machine and is supposed to have another sleep study.

## 2012-11-03 NOTE — Progress Notes (Signed)
Nurse called Consuello Bossier at Dr. Ophelia Charter office and left a voicemail requesting that Dr. Ophelia Charter sign orders at this earliest convenience as surgery is tomorrow.

## 2012-11-03 NOTE — H&P (Signed)
  [facility]   A Division of [employer]   [address] [place], [place] [address] Telephone: [phone]  Fax: [phone]     PATIENT: Barbara Andersen, Barbara Andersen   MR#: [id number]  DOB: 15-Apr-1976   Visit Date: 11/02/2012    Chief Complaint: left ankle fracture HISTORY OF PRESENT ILLNESS:  A 37 year old female seen with acute injury yesterday, 11/01/2012, when she jumped into a shallow pool.  She has jumped in before.  Landed on the bottom, felt a sharp pain/snap, and was unable to weight bear on her left ankle.  She was seen at [facility] ER where x-rays were taken, which showed a pronation/external rotation type injury with a fibular fracture, oblique, spiral, 7-9 cm above the joint with widening of the medial clear space of the ankle medially consistent with deltoid medial ligamentous disruption.  Syndesmosis is disrupted and she will need surgical treatment.     CURRENT MEDICATIONS:  She is on Celebrex 200 mg 1 a day and Neurontin 100 mg 3 times a day, Zoloft 50 mg daily, omeprazole 40 mg daily, Adderall 20 mg 2 a day, One-a-Day women's multivitamins daily.    ALLERGIES:  Seafood and bee stings.    PAST MEDICAL/SURGICAL HISTORY:  Previous surgeries include 2 D&Cs, 2002 and 2003; tubal ligation 2006, 2 cesarean sections 2004 and 2005, kidney stone removal approximately 2010.    SOCIAL HISTORY:  The patient is divorced.  She works for the school system doing school photography.  She does not smoke or drink.  School is currently out of session.  She has 2 children who are about 8 and 10.     FAMILY HISTORY:  Positive for lung and skin cancer as well as lung disease.    REVIEW OF SYSTEMS:  Positive for acid reflux, arthritis, depression, sleep apnea, teeth and gum disease, adult ADD.     PHYSICAL EXAMINATION:  The patient is 5 feet 8 inches, 270 pounds.  Alert and oriented.  Extraocular movements intact.  Good orientation x4.  No rash over exposed  skin.  She is in a posterior splint with Webril underneath an Ace wrap.  Sensation of her foot is intact.  Splint fit is good.  No other injuries to her upper extremities.  Lungs:  Clear to auscultation.  Heart:  Regular rate and rhythm.  She did not have any lab work obtained while she was at the hospital.    RADIOGRAPHS/TEST:  X-rays are reviewed with the patient, which show the widening of the medial clear space, fibular fracture.     ASSESSMENT/DIAGNOSIS:  She will require ORIF of the fibula with syndesmosis screw placement under C-arm visualization intraoperatively with reduction.     PLAN:  Planned procedure discussed with the patient.  She understands she will be non-weightbearing.  Wheelchair prescription given.  We discussed she may need some help with her children as she will be non-weightbearing for at least 8-12 weeks.  Risks of surgery discussed, including bleeding, infection.  Ancef will be ordered preoperatively.  All questions answered.  She requests we proceed.  Risks of nonunion, re-fixation, syndesmosis revision surgery and reoperation all discussion.  Informed consent was obtained verbally.     For additional information please see handwritten notes, reports, orders and prescriptions in this chart.      Korrey Schleicher C. Ophelia Charter, M.D.    Auto-Authenticated by Veverly Fells. Ophelia Charter, M.D.  MCY/sw DD: 11/02/2012  DT: 11/03/2012

## 2012-11-04 ENCOUNTER — Ambulatory Visit (HOSPITAL_COMMUNITY): Payer: Medicaid Other

## 2012-11-04 ENCOUNTER — Observation Stay (HOSPITAL_COMMUNITY)
Admission: RE | Admit: 2012-11-04 | Discharge: 2012-11-06 | Disposition: A | Discharge status: Home / Self Care | Payer: Medicaid Other | Source: Ambulatory Visit | Attending: Orthopaedic Surgery | Admitting: Orthopaedic Surgery

## 2012-11-04 ENCOUNTER — Encounter (HOSPITAL_COMMUNITY): Payer: Self-pay | Admitting: *Deleted

## 2012-11-04 ENCOUNTER — Encounter (HOSPITAL_COMMUNITY): Payer: Self-pay | Admitting: Certified Registered"

## 2012-11-04 ENCOUNTER — Encounter (HOSPITAL_COMMUNITY)
Admission: RE | Disposition: A | Discharge status: Home / Self Care | Payer: Self-pay | Source: Ambulatory Visit | Attending: Orthopaedic Surgery

## 2012-11-04 ENCOUNTER — Ambulatory Visit (HOSPITAL_COMMUNITY): Payer: Medicaid Other | Admitting: Certified Registered"

## 2012-11-04 DIAGNOSIS — K219 Gastro-esophageal reflux disease without esophagitis: Secondary | ICD-10-CM | POA: Insufficient documentation

## 2012-11-04 DIAGNOSIS — F3130 Bipolar disorder, current episode depressed, mild or moderate severity, unspecified: Secondary | ICD-10-CM | POA: Insufficient documentation

## 2012-11-04 DIAGNOSIS — Z8711 Personal history of peptic ulcer disease: Secondary | ICD-10-CM | POA: Insufficient documentation

## 2012-11-04 DIAGNOSIS — IMO0002 Reserved for concepts with insufficient information to code with codable children: Secondary | ICD-10-CM | POA: Insufficient documentation

## 2012-11-04 DIAGNOSIS — Y9311 Activity, swimming: Secondary | ICD-10-CM | POA: Insufficient documentation

## 2012-11-04 DIAGNOSIS — S8263XA Displaced fracture of lateral malleolus of unspecified fibula, initial encounter for closed fracture: Principal | ICD-10-CM | POA: Insufficient documentation

## 2012-11-04 DIAGNOSIS — S93429A Sprain of deltoid ligament of unspecified ankle, initial encounter: Secondary | ICD-10-CM | POA: Insufficient documentation

## 2012-11-04 DIAGNOSIS — S93439A Sprain of tibiofibular ligament of unspecified ankle, initial encounter: Secondary | ICD-10-CM | POA: Insufficient documentation

## 2012-11-04 DIAGNOSIS — S82892A Other fracture of left lower leg, initial encounter for closed fracture: Secondary | ICD-10-CM

## 2012-11-04 DIAGNOSIS — G4730 Sleep apnea, unspecified: Secondary | ICD-10-CM | POA: Insufficient documentation

## 2012-11-04 HISTORY — PX: ORIF FIBULA FRACTURE: SHX5114

## 2012-11-04 HISTORY — PX: ORIF ANKLE FRACTURE: SUR919

## 2012-11-04 LAB — BASIC METABOLIC PANEL
Calcium: 9.2 mg/dL (ref 8.4–10.5)
GFR calc Af Amer: >90 mL/min (ref >90)
GFR calc non Af Amer: >90 mL/min (ref >90)
Glucose, Bld: 93 mg/dL (ref 70–99)
Potassium: 3.9 mEq/L (ref 3.5–5.1)
Sodium: 138 mEq/L (ref 135–145)

## 2012-11-04 LAB — SURGICAL PCR SCREEN
MRSA, PCR: NEGATIVE
Staphylococcus aureus: NEGATIVE

## 2012-11-04 LAB — CBC
MCH: 26.4 pg (ref 26.0–34.0)
Platelets: 219 10*3/uL (ref 150–400)
RBC: 5 MIL/uL (ref 3.87–5.11)

## 2012-11-04 LAB — PROTIME-INR
INR: 0.95 (ref 0.00–1.49)
Prothrombin Time: 12.5 seconds (ref 11.6–15.2)

## 2012-11-04 SURGERY — OPEN REDUCTION INTERNAL FIXATION (ORIF) FIBULA FRACTURE
Anesthesia: General | Site: Ankle | Laterality: Left | Wound class: Clean

## 2012-11-04 MED ORDER — LACTATED RINGERS IV SOLN
INTRAVENOUS | Status: DC | PRN
  Administered 2012-11-04 (×2): via INTRAVENOUS

## 2012-11-04 MED ORDER — MIDAZOLAM HCL 2 MG/2ML IJ SOLN
INTRAMUSCULAR | Status: AC
  Filled 2012-11-04: qty 2

## 2012-11-04 MED ORDER — 0.9 % SODIUM CHLORIDE (POUR BTL) OPTIME
TOPICAL | Status: DC | PRN
  Administered 2012-11-04: 1000 mL

## 2012-11-04 MED ORDER — OXYCODONE-ACETAMINOPHEN 5-325 MG PO TABS
2.0000 | ORAL_TABLET | ORAL | Status: DC | PRN
  Administered 2012-11-05 – 2012-11-06 (×6): 2 via ORAL
  Filled 2012-11-04 (×6): qty 2

## 2012-11-04 MED ORDER — METHOCARBAMOL 500 MG PO TABS
500.0000 mg | ORAL_TABLET | Freq: Four times a day (QID) | ORAL | Status: DC | PRN
  Administered 2012-11-05 – 2012-11-06 (×4): 500 mg via ORAL
  Filled 2012-11-04 (×4): qty 1

## 2012-11-04 MED ORDER — OXYCODONE HCL 5 MG PO TABS: 5.0000 mg | ORAL_TABLET | Freq: Once | ORAL | Status: DC | PRN

## 2012-11-04 MED ORDER — ONDANSETRON HCL 4 MG/2ML IJ SOLN
INTRAMUSCULAR | Status: AC
Start: 2012-11-04 — End: 2012-11-04
  Administered 2012-11-04: 4 mg via INTRAVENOUS
  Filled 2012-11-04: qty 2

## 2012-11-04 MED ORDER — METHOCARBAMOL 100 MG/ML IJ SOLN
500.0000 mg | Freq: Four times a day (QID) | INTRAVENOUS | Status: DC | PRN
  Filled 2012-11-04: qty 5

## 2012-11-04 MED ORDER — HYDROMORPHONE HCL PF 1 MG/ML IJ SOLN
0.5000 mg | INTRAMUSCULAR | Status: DC | PRN
  Administered 2012-11-05 (×2): 1 mg via INTRAVENOUS
  Filled 2012-11-04 (×2): qty 1

## 2012-11-04 MED ORDER — PROMETHAZINE HCL 25 MG/ML IJ SOLN: 6.2500 mg | INTRAMUSCULAR | Status: DC | PRN

## 2012-11-04 MED ORDER — PROPOFOL 10 MG/ML IV BOLUS
INTRAVENOUS | Status: DC | PRN
  Administered 2012-11-04: 150 mg via INTRAVENOUS

## 2012-11-04 MED ORDER — METOCLOPRAMIDE HCL 5 MG/ML IJ SOLN: 5.0000 mg | Freq: Three times a day (TID) | INTRAMUSCULAR | Status: DC | PRN

## 2012-11-04 MED ORDER — OXYCODONE HCL 5 MG/5ML PO SOLN: 5.0000 mg | Freq: Once | ORAL | Status: DC | PRN

## 2012-11-04 MED ORDER — SERTRALINE HCL 50 MG PO TABS
50.0000 mg | ORAL_TABLET | Freq: Every morning | ORAL | Status: DC
  Administered 2012-11-05 – 2012-11-06 (×2): 50 mg via ORAL
  Filled 2012-11-04 (×2): qty 1

## 2012-11-04 MED ORDER — CELECOXIB 100 MG PO CAPS
100.0000 mg | ORAL_CAPSULE | Freq: Every morning | ORAL | Status: DC
  Administered 2012-11-05 – 2012-11-06 (×2): 100 mg via ORAL
  Filled 2012-11-04 (×2): qty 1

## 2012-11-04 MED ORDER — BUPIVACAINE-EPINEPHRINE PF 0.5-1:200000 % IJ SOLN
INTRAMUSCULAR | Status: DC | PRN
  Administered 2012-11-04: 30 mL
  Administered 2012-11-04: 10 mL

## 2012-11-04 MED ORDER — GABAPENTIN 100 MG PO CAPS
100.0000 mg | ORAL_CAPSULE | Freq: Three times a day (TID) | ORAL | Status: DC
  Administered 2012-11-04 – 2012-11-06 (×5): 100 mg via ORAL
  Filled 2012-11-04 (×7): qty 1

## 2012-11-04 MED ORDER — SODIUM CHLORIDE 0.9 % IV SOLN
INTRAVENOUS | Status: DC
Start: 2012-11-04 — End: 2012-11-06

## 2012-11-04 MED ORDER — DEXTROSE 5 % IV SOLN
3.0000 g | INTRAVENOUS | Status: AC
  Administered 2012-11-04: 3 g via INTRAVENOUS
  Filled 2012-11-04: qty 3000

## 2012-11-04 MED ORDER — ONDANSETRON HCL 4 MG/2ML IJ SOLN
INTRAMUSCULAR | Status: DC | PRN
  Administered 2012-11-04: 4 mg via INTRAVENOUS

## 2012-11-04 MED ORDER — MIDAZOLAM HCL 5 MG/5ML IJ SOLN
INTRAMUSCULAR | Status: DC | PRN
  Administered 2012-11-04: 2 mg via INTRAVENOUS

## 2012-11-04 MED ORDER — HYDROMORPHONE HCL PF 1 MG/ML IJ SOLN
0.2500 mg | INTRAMUSCULAR | Status: DC | PRN
  Administered 2012-11-04: 0.5 mg via INTRAVENOUS

## 2012-11-04 MED ORDER — ONDANSETRON HCL 4 MG/2ML IJ SOLN: 4.0000 mg | Freq: Four times a day (QID) | INTRAMUSCULAR | Status: DC | PRN

## 2012-11-04 MED ORDER — ONDANSETRON HCL 4 MG PO TABS: 4.0000 mg | ORAL_TABLET | Freq: Four times a day (QID) | ORAL | Status: DC | PRN

## 2012-11-04 MED ORDER — MIDAZOLAM HCL 2 MG/2ML IJ SOLN: 0.5000 mg | Freq: Once | INTRAMUSCULAR | Status: DC | PRN

## 2012-11-04 MED ORDER — PANTOPRAZOLE SODIUM 40 MG PO TBEC
80.0000 mg | DELAYED_RELEASE_TABLET | Freq: Every day | ORAL | Status: DC
  Administered 2012-11-04 – 2012-11-06 (×3): 80 mg via ORAL
  Filled 2012-11-04 (×3): qty 2

## 2012-11-04 MED ORDER — FENTANYL CITRATE 0.05 MG/ML IJ SOLN
INTRAMUSCULAR | Status: AC
  Filled 2012-11-04: qty 2

## 2012-11-04 MED ORDER — METOCLOPRAMIDE HCL 10 MG PO TABS: 5.0000 mg | ORAL_TABLET | Freq: Three times a day (TID) | ORAL | Status: DC | PRN

## 2012-11-04 MED ORDER — MEPERIDINE HCL 25 MG/ML IJ SOLN: 6.2500 mg | INTRAMUSCULAR | Status: DC | PRN

## 2012-11-04 MED ORDER — FENTANYL CITRATE 0.05 MG/ML IJ SOLN
INTRAMUSCULAR | Status: DC | PRN
  Administered 2012-11-04 (×2): 100 ug via INTRAVENOUS

## 2012-11-04 MED ORDER — HYDROMORPHONE HCL PF 1 MG/ML IJ SOLN
INTRAMUSCULAR | Status: AC
  Administered 2012-11-04: 0.5 mg via INTRAVENOUS
  Filled 2012-11-04: qty 1

## 2012-11-04 MED ORDER — AMPHETAMINE-DEXTROAMPHETAMINE 10 MG PO TABS
20.0000 mg | ORAL_TABLET | Freq: Two times a day (BID) | ORAL | Status: DC
  Administered 2012-11-04 – 2012-11-06 (×4): 20 mg via ORAL
  Filled 2012-11-04 (×4): qty 2

## 2012-11-04 SURGICAL SUPPLY — 65 items
BANDAGE ELASTIC 4 VELCRO ST LF (GAUZE/BANDAGES/DRESSINGS) ×1 IMPLANT
BANDAGE ELASTIC 6 VELCRO ST LF (GAUZE/BANDAGES/DRESSINGS) ×1 IMPLANT
BANDAGE ESMARK 6X9 LF (GAUZE/BANDAGES/DRESSINGS) IMPLANT
BIT DRILL 2.5X110 QC LCP DISP (BIT) ×1 IMPLANT
BNDG CMPR 9X6 STRL LF SNTH (GAUZE/BANDAGES/DRESSINGS)
BNDG ESMARK 6X9 LF (GAUZE/BANDAGES/DRESSINGS)
CHLORAPREP W/TINT 26ML (MISCELLANEOUS) ×4 IMPLANT
CLOTH BEACON ORANGE TIMEOUT ST (SAFETY) ×2 IMPLANT
COVER MAYO STAND STRL (DRAPES) ×2 IMPLANT
COVER SURGICAL LIGHT HANDLE (MISCELLANEOUS) ×2 IMPLANT
CUFF TOURNIQUET SINGLE 34IN LL (TOURNIQUET CUFF) ×1 IMPLANT
CUFF TOURNIQUET SINGLE 44IN (TOURNIQUET CUFF) IMPLANT
DRAPE C-ARM 42X72 X-RAY (DRAPES) ×1 IMPLANT
DRAPE C-ARMOR (DRAPES) ×1 IMPLANT
DRAPE INCISE IOBAN 66X45 STRL (DRAPES) ×2 IMPLANT
DRAPE PROXIMA HALF (DRAPES) ×2 IMPLANT
DRAPE U-SHAPE 47X51 STRL (DRAPES) ×2 IMPLANT
DRSG PAD ABDOMINAL 8X10 ST (GAUZE/BANDAGES/DRESSINGS) ×2 IMPLANT
DURAPREP 26ML APPLICATOR (WOUND CARE) ×1 IMPLANT
ELECT REM PT RETURN 9FT ADLT (ELECTROSURGICAL) ×2
ELECTRODE REM PT RTRN 9FT ADLT (ELECTROSURGICAL) ×1 IMPLANT
GAUZE XEROFORM 5X9 LF (GAUZE/BANDAGES/DRESSINGS) ×1 IMPLANT
GLOVE BIOGEL PI IND STRL 7.5 (GLOVE) ×1 IMPLANT
GLOVE BIOGEL PI IND STRL 8 (GLOVE) ×1 IMPLANT
GLOVE BIOGEL PI INDICATOR 7.5 (GLOVE)
GLOVE BIOGEL PI INDICATOR 8 (GLOVE) ×1
GLOVE ECLIPSE 7.0 STRL STRAW (GLOVE) IMPLANT
GLOVE ORTHO TXT STRL SZ7.5 (GLOVE) ×2 IMPLANT
GOWN PREVENTION PLUS LG XLONG (DISPOSABLE) IMPLANT
GOWN PREVENTION PLUS XLARGE (GOWN DISPOSABLE) ×2 IMPLANT
GOWN STRL NON-REIN LRG LVL3 (GOWN DISPOSABLE) ×4 IMPLANT
KIT 1/3 TUB PL 8H 97M (Orthopedic Implant) IMPLANT
KIT BASIN OR (CUSTOM PROCEDURE TRAY) ×2 IMPLANT
KIT ROOM TURNOVER OR (KITS) ×2 IMPLANT
MANIFOLD NEPTUNE II (INSTRUMENTS) ×2 IMPLANT
NS IRRIG 1000ML POUR BTL (IV SOLUTION) ×2 IMPLANT
PACK ORTHO EXTREMITY (CUSTOM PROCEDURE TRAY) ×2 IMPLANT
PAD ARMBOARD 7.5X6 YLW CONV (MISCELLANEOUS) ×4 IMPLANT
PAD CAST 4YDX4 CTTN HI CHSV (CAST SUPPLIES) ×1 IMPLANT
PADDING CAST ABS 6INX4YD NS (CAST SUPPLIES) ×1
PADDING CAST ABS COTTON 6X4 NS (CAST SUPPLIES) ×1 IMPLANT
PADDING CAST COTTON 4X4 STRL (CAST SUPPLIES) ×2
PADDING CAST COTTON 6X4 STRL (CAST SUPPLIES) ×2 IMPLANT
PROS 1/3 TUB PL 8H 97M (Orthopedic Implant) ×2 IMPLANT
SCREW CANC FT ST SFS 4X14 (Screw) ×2 IMPLANT
SCREW CANC PT 4.0X40 (Screw) ×2 IMPLANT
SCREW CANC PT 40X14X4X6X (Screw) IMPLANT
SCREW CORTEX 3.5 12MM (Screw) ×5 IMPLANT
SCREW CORTEX 3.5 14MM (Screw) ×2 IMPLANT
SCREW LOCK CORT ST 3.5X12 (Screw) ×5 IMPLANT
SCREW LOCK CORT ST 3.5X14 (Screw) IMPLANT
SPLINT PLASTER CAST XFAST 5X30 (CAST SUPPLIES) IMPLANT
SPLINT PLASTER XFAST SET 5X30 (CAST SUPPLIES) ×1
SPONGE GAUZE 4X4 12PLY (GAUZE/BANDAGES/DRESSINGS) ×2 IMPLANT
SPONGE LAP 18X18 X RAY DECT (DISPOSABLE) ×2 IMPLANT
STAPLER VISISTAT 35W (STAPLE) ×1 IMPLANT
SUCTION FRAZIER TIP 10 FR DISP (SUCTIONS) ×2 IMPLANT
SUT ETHILON 3 0 PS 1 (SUTURE) ×4 IMPLANT
SUT VIC AB 2-0 CT1 27 (SUTURE) ×4
SUT VIC AB 2-0 CT1 TAPERPNT 27 (SUTURE) ×2 IMPLANT
TOWEL OR 17X24 6PK STRL BLUE (TOWEL DISPOSABLE) ×2 IMPLANT
TOWEL OR 17X26 10 PK STRL BLUE (TOWEL DISPOSABLE) ×2 IMPLANT
TUBE CONNECTING 12X1/4 (SUCTIONS) ×2 IMPLANT
WATER STERILE IRR 1000ML POUR (IV SOLUTION) ×1 IMPLANT
YANKAUER SUCT BULB TIP NO VENT (SUCTIONS) ×2 IMPLANT

## 2012-11-04 NOTE — Preoperative (Signed)
Beta Blockers   Reason not to administer Beta Blockers:Not Applicable 

## 2012-11-04 NOTE — Anesthesia Postprocedure Evaluation (Signed)
Anesthesia Post Note  Patient: Barbara Andersen  Procedure(s) Performed: Procedure(s) (LRB): OPEN REDUCTION INTERNAL FIXATION (ORIF) FIBULA FRACTURE (Left)  Anesthesia type: general  Patient location: PACU  Post pain: Pain level controlled  Post assessment: Patient's Cardiovascular Status Stable  Last Vitals:  Filed Vitals:   11/04/12 1945  BP: 112/67  Pulse: 94  Temp:   Resp: 20    Post vital signs: Reviewed and stable  Level of consciousness: sedated  Complications: No apparent anesthesia complications

## 2012-11-04 NOTE — Anesthesia Preprocedure Evaluation (Addendum)
Anesthesia Evaluation  Patient identified by MRN, date of birth, ID band Patient awake    Reviewed: Allergy & Precautions, H&P , NPO status , Patient's Chart, lab work & pertinent test results  History of Anesthesia Complications Negative for: history of anesthetic complications  Airway Mallampati: II TM Distance: >3 FB Neck ROM: Full    Dental  (+) Missing and Dental Advisory Given   Pulmonary sleep apnea ,  breath sounds clear to auscultation  Pulmonary exam normal       Cardiovascular negative cardio ROS  Rhythm:Regular Rate:Normal     Neuro/Psych PSYCHIATRIC DISORDERS Anxiety Depression Bipolar Disorder    GI/Hepatic Neg liver ROS, GERD-  Medicated and Controlled,  Endo/Other  Morbid obesity  Renal/GU negative Renal ROS     Musculoskeletal   Abdominal (+) + obese,   Peds  Hematology negative hematology ROS (+)   Anesthesia Other Findings   Reproductive/Obstetrics LMP 2 weeks ago                          Anesthesia Physical Anesthesia Plan  ASA: III  Anesthesia Plan: General   Post-op Pain Management:    Induction: Intravenous  Airway Management Planned: LMA  Additional Equipment:   Intra-op Plan:   Post-operative Plan:   Informed Consent: I have reviewed the patients History and Physical, chart, labs and discussed the procedure including the risks, benefits and alternatives for the proposed anesthesia with the patient or authorized representative who has indicated his/her understanding and acceptance.   Dental advisory given  Plan Discussed with: CRNA and Surgeon  Anesthesia Plan Comments: (Plan routine monitors, GA- LMA OK, popliteal block for post op analgesia)        Anesthesia Quick Evaluation

## 2012-11-04 NOTE — Interval H&P Note (Signed)
History and Physical Interval Note:  11/04/2012 5:57 PM  Barbara Andersen  has presented today for surgery, with the diagnosis of Left ankle fibula fracture, syndesmotic rupture  The various methods of treatment have been discussed with the patient and family. After consideration of risks, benefits and other options for treatment, the patient has consented to  Procedure(s) with comments: OPEN REDUCTION INTERNAL FIXATION (ORIF) FIBULA FRACTURE (Left) - Open Reduction Internal Fixation left Fibula and Syndesmotic Screw as a surgical intervention .  The patient's history has been reviewed, patient examined, no change in status, stable for surgery.  I have reviewed the patient's chart and labs.  Questions were answered to the patient's satisfaction.     Barbara Andersen

## 2012-11-04 NOTE — Brief Op Note (Signed)
11/04/2012  7:22 PM  PATIENT:  Barbara Andersen  37 y.o. female  PRE-OPERATIVE DIAGNOSIS:  Left ankle fibula fracture, syndesmotic rupture  POST-OPERATIVE DIAGNOSIS:  Left Ankle Fibula Fracture Syndesmotic Rupture  PROCEDURE:  Procedure(s) with comments: OPEN REDUCTION INTERNAL FIXATION (ORIF) FIBULA FRACTURE (Left) - Open Reduction Internal Fixation left Fibula and Syndesmotic Screw  SURGEON:  Surgeon(s) and Role:    * Eldred Manges, MD - Primary  PHYSICIAN ASSISTANT:   ASSISTANTS: none   ANESTHESIA:   regional and general  EBL:  Total I/O In: 300 [I.V.:300] Out: 20 [Urine:20]  BLOOD ADMINISTERED:none  DRAINS: none   LOCAL MEDICATIONS USED:  NONE  SPECIMEN:  No Specimen  DISPOSITION OF SPECIMEN:  N/A  COUNTS:  YES  TOURNIQUET:  * Missing tourniquet times found for documented tourniquets in log:  [id number] *  DICTATION:done  PLAN OF CARE: Admit for overnight observation  PATIENT DISPOSITION:  PACU - hemodynamically stable.   Delay start of Pharmacological VTE agent (>24hrs) due to surgical blood loss or risk of bleeding: yes

## 2012-11-04 NOTE — Transfer of Care (Signed)
Immediate Anesthesia Transfer of Care Note  Patient: Barbara Andersen  Procedure(s) Performed: Procedure(s) with comments: OPEN REDUCTION INTERNAL FIXATION (ORIF) FIBULA FRACTURE (Left) - Open Reduction Internal Fixation left Fibula and Syndesmotic Screw  Patient Location: PACU  Anesthesia Type:General  Level of Consciousness: awake, alert  and oriented  Airway & Oxygen Therapy: Patient Spontanous Breathing and Patient connected to nasal cannula oxygen  Post-op Assessment: Report given to PACU RN and Post -op Vital signs reviewed and stable  Post vital signs: Reviewed and stable  Complications: No apparent anesthesia complications

## 2012-11-04 NOTE — Anesthesia Procedure Notes (Addendum)
Anesthesia Regional Block:  Popliteal block  Pre-Anesthetic Checklist: ,, timeout performed, Correct Patient, Correct Site, Correct Laterality, Correct Procedure, Correct Position, site marked, Risks and benefits discussed,  Surgical consent,  Pre-op evaluation,  At surgeon's request and post-op pain management  Laterality: Left  Prep: chloraprep       Needles:  Injection technique: Single-shot  Needle Type: Stimulator Needle - 80      Needle Gauge: 22 and 22 G    Additional Needles:  Procedures: nerve stimulator Popliteal block  Nerve Stimulator or Paresthesia:  Response: toe dorsiflexion, 0.45 mA, 0.1 ms,  Response: toe abduction , 0.45 mA, 0.1 ms,   Additional Responses:   Narrative:  Start time: 11/04/2012 5:48 PM End time: 11/04/2012 5:52 PM Injection made incrementally with aspirations every 5 mL.  Events: injection painful  Performed by: Personally  Anesthesiologist: Sandford Craze, MD  Additional Notes: Pt identified in Holding room.  Monitors applied. Working IV access confirmed. Sterile prep L lateral knee.  #22ga PNS to toe dorsiflexion and toe abduction twitches at 0.86mA threshold. 1cc 0.5% Bupivacaine with 1:200k epi test dose painful, needle withdrawn 1-71mm and repeat test not painful,  Total 30cc injected incrementally.  Prep prox, ant-med tibia, 10cc 0.5% Bupivacaine with 1:200k epi injected subq for saphenous nerve supplementation.  Patient asymptomatic, VSS, no heme aspirated, tolerated well.  Sandford Craze, MD   Procedure Name: LMA Insertion Date/Time: 11/04/2012 6:14 PM Performed by: Gwenyth Allegra Pre-anesthesia Checklist: Patient identified, Timeout performed, Emergency Drugs available, Suction available and Patient being monitored Patient Re-evaluated:Patient Re-evaluated prior to inductionOxygen Delivery Method: Circle system utilized Preoxygenation: Pre-oxygenation with 100% oxygen Intubation Type: IV induction LMA: LMA inserted LMA Size: 4.0 Number  of attempts: 1 Placement Confirmation: positive ETCO2 and breath sounds checked- equal and bilateral Tube secured with: Tape Dental Injury: Teeth and Oropharynx as per pre-operative assessment

## 2012-11-05 ENCOUNTER — Encounter (HOSPITAL_COMMUNITY): Payer: Self-pay | Admitting: General Practice

## 2012-11-05 MED ORDER — ONDANSETRON HCL 4 MG PO TABS: 4.0000 mg | ORAL_TABLET | Freq: Four times a day (QID) | ORAL | Status: DC | PRN

## 2012-11-05 MED ORDER — ASPIRIN EC 325 MG PO TBEC: 325.0000 mg | DELAYED_RELEASE_TABLET | Freq: Every day | ORAL | Status: DC

## 2012-11-05 MED ORDER — OXYCODONE-ACETAMINOPHEN 5-325 MG PO TABS: 1.0000 | ORAL_TABLET | ORAL | Status: DC | PRN

## 2012-11-05 NOTE — Progress Notes (Signed)
Pt experiencing increased pain this afternoon. Unrelieved by oral pain medication, pain 10 out of a 10. Able to wiggle toes. Administered 1mg  of dilaudid at 1521. Reassessed pain at 1630 and pt rated it an 8. Paged Dr. Ophelia Charter about the pain and how the pt did not feel comfortable going home today. Pt going to stay tonight and Dr. Ophelia Charter will notify Dr. Magnus Ivan who is on call for the weekend about patient staying. Will continue to monitor.

## 2012-11-05 NOTE — Discharge Summary (Signed)
Physician Discharge Summary  Patient ID: Barbara Andersen MRN: [id number] DOB/AGE: 37-Dec-1977 37 y.o.  Admit date: 11/04/2012 Discharge date: 11/05/2012  Admission Diagnoses:  Ankle fracture, left  Discharge Diagnoses:  Principal Problem:   Ankle fracture, left   Past Medical History  Diagnosis Date  . Bipolar 1 disorder   . Depression   . GERD (gastroesophageal reflux disease)   . Sleep apnea   . Disc degeneration   . Ulcer disease     Surgeries: Procedure(s): OPEN REDUCTION INTERNAL FIXATION (ORIF) FIBULA FRACTURE on 11/04/2012 and syndesmotic screw   Consultants (if any):   none  Discharged Condition: Improved  Hospital Course: Barbara Andersen is an 37 y.o. female who was admitted 11/04/2012 with a diagnosis of Ankle fracture, left and went to the operating room on 11/04/2012 and underwent the above named procedures.    She was given perioperative antibiotics:      Anti-infectives   Start     Dose/Rate Route Frequency Ordered Stop   11/05/12 0600  ceFAZolin (ANCEF) 3 g in dextrose 5 % 50 mL IVPB     3 g 160 mL/hr over 30 Minutes Intravenous On call to O.R. 11/04/12 1457 11/04/12 1800    .  She was given sequential compression devices, early ambulation, and aspirin for DVT prophylaxis.  She benefited maximally from the hospital stay and there were no complications.    Recent vital signs:  Filed Vitals:   11/05/12 0505  BP: 103/64  Pulse: 80  Temp: 97.7 F (36.5 C)  Resp: 16    Recent laboratory studies:  Lab Results  Component Value Date   HGB 13.2 11/04/2012   HGB 12.4 05/06/2010   HGB 14.0 12/01/2008   Lab Results  Component Value Date   WBC 12.1* 11/04/2012   PLT 219 11/04/2012   Lab Results  Component Value Date   INR 0.95 11/04/2012   Lab Results  Component Value Date   NA 138 11/04/2012   K 3.9 11/04/2012   CL 102 11/04/2012   CO2 26 11/04/2012   BUN 12 11/04/2012   CREATININE 0.62 11/04/2012   GLUCOSE 93 11/04/2012    Discharge  Medications:     Medication List    TAKE these medications       amphetamine-dextroamphetamine 20 MG tablet  Commonly known as:  ADDERALL  Take 20 mg by mouth 2 (two) times daily.     aspirin EC 325 MG tablet  Take 1 tablet (325 mg total) by mouth daily.     celecoxib 100 MG capsule  Commonly known as:  CELEBREX  Take 100 mg by mouth every morning.     gabapentin 100 MG capsule  Commonly known as:  NEURONTIN  Take 100 mg by mouth 3 (three) times daily.     multivitamins ther. w/minerals Tabs  Take 1 tablet by mouth every morning.     omeprazole 40 MG capsule  Commonly known as:  PRILOSEC  Take 40 mg by mouth every morning.     ondansetron 4 MG tablet  Commonly known as:  ZOFRAN  Take 1 tablet (4 mg total) by mouth every 6 (six) hours as needed for nausea.     oxyCODONE-acetaminophen 5-325 MG per tablet  Commonly known as:  PERCOCET  Take 1-2 tablets by mouth every 6 (six) hours as needed for pain.     oxyCODONE-acetaminophen 5-325 MG per tablet  Commonly known as:  PERCOCET  Take 1-2 tablets by mouth every 4 (four) hours  as needed.     sertraline 50 MG tablet  Commonly known as:  ZOLOFT  Take 50 mg by mouth every morning.        Diagnostic Studies: Dg Ankle Complete Left  2012-11-14   *RADIOLOGY REPORT*  Clinical Data: Fracture fixation  DG C-ARM 1-60 MIN,LEFT ANKLE COMPLETE - 3+ VIEW  Comparison: 11/01/2012  Findings: Fracture of the distal fibula has been fixed with a plate and multiple screws.  Ankle mortise has been restored.  There is a screw extending through the fibula into the tibia.  IMPRESSION: Satisfactory repair of ankle fracture dislocation.   Original Report Authenticated By: Janeece Riggers, M.D.   Dg Ankle Complete Left  11/01/2012   *RADIOLOGY REPORT*  Clinical Data: Ankle pain.  Injury  LEFT ANKLE COMPLETE - 3+ VIEW  Comparison: None  Findings: Oblique fracture distal fibula with mild displacement. There is  lateral displacement of the talus relative  to the tibia compatible with medial ligament injury.  There is diffuse soft tissue swelling.  No fracture of the tibia.  There is spurring of the calcaneus.  IMPRESSION: Fracture of the distal fibula.  Medial ligament injury.   Original Report Authenticated By: Janeece Riggers, M.D.   Dg C-arm 1-60 Min  11-14-12   *RADIOLOGY REPORT*  Clinical Data: Fracture fixation  DG C-ARM 1-60 MIN,LEFT ANKLE COMPLETE - 3+ VIEW  Comparison: 11/01/2012  Findings: Fracture of the distal fibula has been fixed with a plate and multiple screws.  Ankle mortise has been restored.  There is a screw extending through the fibula into the tibia.  IMPRESSION: Satisfactory repair of ankle fracture dislocation.   Original Report Authenticated By: Janeece Riggers, M.D.    Disposition: 01-Home or Self Care  Discharge Orders   Future Orders Complete By Expires     Call MD / Call 911  As directed     Comments:      If you experience chest pain or shortness of breath, CALL 911 and be transported to the hospital emergency room.  If you develope a fever above 101 F, pus (white drainage) or increased drainage or redness at the wound, or calf pain, call your surgeon's office.    Constipation Prevention  As directed     Comments:      Drink plenty of fluids.  Prune juice may be helpful.  You may use a stool softener, such as Colace (over the counter) 100 mg twice a day.  Use MiraLax (over the counter) for constipation as needed.    Diet - low sodium heart healthy  As directed     Discharge instructions  As directed     Comments:      Keep short leg splint and dressing dry. May use water impervious bag or cast bag and tub chair to shower Tape the top of bag to skin to avoid moisture soaking the dressing on the leg. Call if there is odor or saturation of dressing or worsening pain not controlled with medications. Call if fever greater than 101.5. Use crutches or walker no weight bearing on the ankle fracture leg. Please follow up with  an appointment with Dr. Ophelia Charter 2 weeks from the time of surgery. Elevate as often as possible during the first week after surgery gradually increasing the time the leg is dependent or down there after. If swelling recurrs then elevate again.    Increase activity slowly as tolerated  As directed     Non weight bearing  As directed  Comments:      Left leg    Walker rolling  As directed        Follow-up Information   Follow up with YATES,MARK C, MD In 2 weeks. (or as scheduled)    Contact information:   [address] [place] [address] [phone]        Signed: Wende Neighbors 11/05/2012, 10:07 AM

## 2012-11-05 NOTE — Progress Notes (Signed)
Subjective: 1 Day Post-Op Procedure(s) (LRB): OPEN REDUCTION INTERNAL FIXATION (ORIF) FIBULA FRACTURE (Left) Patient reports pain as moderate.   Having some nausea this am, but no vomiting.  Out of bed to chair and walked in hallway Needs walker for home  Objective: Vital signs in last 24 hours: Temp:  [97.7 F (36.5 C)-98.6 F (37 C)] 97.7 F (36.5 C) (06/27 0505) Pulse Rate:  [71-109] 80 (06/27 0505) Resp:  [10-21] 16 (06/27 0505) BP: (94-130)/(50-84) 103/64 mmHg (06/27 0505) SpO2:  [96 %-100 %] 98 % (06/27 0505) Weight:  [122.471 kg (270 lb)] 122.471 kg (270 lb) (06/26 1501)  Intake/Output from previous day: 06/26 0701 - 06/27 0700 In: 1975 [I.V.:1975] Out: 20 [Urine:20] Intake/Output this shift:     Recent Labs  11/04/12 1530  HGB 13.2    Recent Labs  11/04/12 1530  WBC 12.1*  RBC 5.00  HCT 40.3  PLT 219    Recent Labs  11/04/12 1530  NA 138  K 3.9  CL 102  CO2 26  BUN 12  CREATININE 0.62  GLUCOSE 93  CALCIUM 9.2    Recent Labs  11/04/12 1530  INR 0.95    Sensation intact distally Incision: dressing C/D/I cap refill of toes intact  Assessment/Plan: 1 Day Post-Op Procedure(s) (LRB): OPEN REDUCTION INTERNAL FIXATION (ORIF) FIBULA FRACTURE (Left) Up with therapy Home later today. Tolerated Percocet prior to procedure and will rx this again.  Also some zofran for nausea if needed OV 2 weeks  Janeene Sand M 11/05/2012, 9:57 AM

## 2012-11-05 NOTE — Op Note (Signed)
NAMECAMBER, Barbara Andersen              ACCOUNT NO.:  [id number]  MEDICAL RECORD NO.:  [id number]  LOCATION:  5N11C                        FACILITY:  [facility]  PHYSICIAN:  Mark C. Ophelia Charter, M.D.    DATE OF BIRTH:  08/05/1975  DATE OF PROCEDURE:  11/04/2012 DATE OF DISCHARGE:                              OPERATIVE REPORT   PREOPERATIVE DIAGNOSIS:  Left lateral malleolar fracture, pronation external rotation injury with deltoid ligament rupture and syndesmosis rupture.  POSTOPERATIVE DIAGNOSIS:  Left lateral malleolar fracture, pronation external rotation injury with deltoid ligament rupture and syndesmosis rupture.  PROCEDURE:  ORIF distal fibula and ORIF syndesmosis.  SURGEON:  Mark C. Ophelia Charter, MD.  TOURNIQUET TIME:  30 minutes.  ANESTHESIA:  General.  EBL:  Minimal.  INDICATION:  A 37 year old female jumped into a pool that was shallow, landed on her feet, suffering a fracture dislocation of her ankle with pronation external rotation type injury with a more proximal fibular fracture, spiral, starting 7-10 cm above the level of the mortise with widening of the syndesmosis and 2 cm gap of the medial clear space.  She had ecchymosis medially, instability, and had been splinted.  PROCEDURE IN DETAIL:  After induction of general anesthesia, orotracheal intubation, preoperative block had been given for popliteal block for postop pain.  Prep with ChloraPrep was performed up to the proximal thigh tourniquet and the usual extremity sheets and drapes were applied up to the knee.  Time-out procedure was completed.  Sterile glove placed on the toes, sterile skin marker laterally, and an incision was made after wrapping the leg with an Esmarch and inflating the tourniquet to 350.  Lateral incision was made.  Subperiosteal dissection on the fibula.  Reduction, evacuation of the hematoma with saline and suction, anatomic reduction, proximal and distal fragments were pulled with lobster claw  clamps, and Verbrugge clamp was used for application once the fracture was anatomically reduced.  An 8-hole one-third Synthes tubular plate was selected and bicortical screws 12 mm were used proximally.  A distal 14 mm fully threaded cancellous screw was placed distally unicortically and second half from the bottom was used for syndesmosis screw placement, a 45 mm partially threaded cancellous for reduction after the rest screw holes had been filled.  One screw forced from the bottom was 14, little long and it was exchanged for 12 and this was initially used compressing down the fracture.  Fracture was anatomic proximally and distally.  It was completely out to length.  Lateral AP and mortise x-rays were taken confirming anatomic reduction. The patient tolerated the procedure well.  After irrigation, tourniquet was deflated, there was good hemostasis.  Standard closure, 2-0 Vicryl, skin staple closure, 4x4s, ABD, Webril and short-leg splint with 5 x 30 splints were applied.     Mark C. Ophelia Charter, M.D.     MCY/MEDQ  D:  11/04/2012  T:  11/05/2012  Job:  [id number]

## 2012-11-05 NOTE — Evaluation (Signed)
Physical Therapy Evaluation Patient Details Name: Barbara Andersen MRN: [id number] DOB: March 27, 1976 Today's Date: 11/05/2012 Time: 7829-5621 PT Time Calculation (min): 25 min  PT Assessment / Plan / Recommendation History of Present Illness  left ankle fx with ORIF  Clinical Impression  Pt is a 37 yr female, who sustained left ankle fx after jumping into a pool.  Underwent ORIF on 11/04/12.  Presents to PT needing overall min@, using RW.  She is NWB on the LLE.  She has requested a w/c for d/c and at this time prefers the RW.  She will benefit from PT for gait and step training and w/c mobility to prepare for discharge home with children, family, and friends to assist.    PT Assessment  Patient needs continued PT services    Follow Up Recommendations  Home health PT    Does the patient have the potential to tolerate intense rehabilitation      Barriers to Discharge Inaccessible home environment (needs to be able to do 4 steps with assistive device)      Equipment Recommendations  Rolling walker with 5" wheels;Wheelchair (measurements PT);Wheelchair cushion (measurements PT) (20x18)    Recommendations for Other Services     Frequency Min 5X/week    Precautions / Restrictions Precautions Precautions: Fall Required Braces or Orthoses:  (left ankle cast) Restrictions Weight Bearing Restrictions: Yes LLE Weight Bearing: Non weight bearing   Pertinent Vitals/Pain No complaints of pain      Mobility  Bed Mobility Bed Mobility: Supine to Sit Supine to Sit: 5: Supervision Transfers Transfers: Systems analyst;Sit to Stand;Stand to Sit Sit to Stand: 4: Min assist Stand to Sit: 4: Min Designer, television/film set Transfers: 4: Min assist Ambulation/Gait Ambulation/Gait Assistance: 4: Min Environmental consultant (Feet): 30 Feet Assistive device: Rolling walker Stairs: No    Exercises     PT Diagnosis: Difficulty walking  PT Problem List: Decreased balance;Decreased  mobility;Decreased knowledge of use of DME PT Treatment Interventions: DME instruction;Gait training;Stair training;Functional mobility training;Therapeutic exercise;Balance training;Patient/family education;Wheelchair mobility training     PT Goals(Current goals can be found in the care plan section) Acute Rehab PT Goals Patient Stated Goal: To be able to manage with her children in w/c PT Goal Formulation: With patient Time For Goal Achievement: 11/12/12 Potential to Achieve Goals: Good  Visit Information  Last PT Received On: 11/05/12 Assistance Needed: +1 History of Present Illness: left ankle fx with ORIF       Prior Functioning  Home Living Family/patient expects to be discharged to:: Private residence Living Arrangements: Children (8 and 10 yrs) Available Help at Discharge: Family;Friend(s) Type of Home: House Home Access: Stairs to enter Secretary/administrator of Steps: 4 Entrance Stairs-Rails: None Home Layout: One level Home Equipment: None Prior Function Level of Independence: Independent Gait / Transfers Assistance Needed: Independent ADL's / Homemaking Assistance Needed: Indepedent Communication Communication: No difficulties    Cognition  Cognition Arousal/Alertness: Awake/alert Behavior During Therapy: WFL for tasks assessed/performed Overall Cognitive Status: Within Functional Limits for tasks assessed    Extremity/Trunk Assessment Upper Extremity Assessment Upper Extremity Assessment: Overall WFL for tasks assessed Lower Extremity Assessment Lower Extremity Assessment: LLE deficits/detail LLE Deficits / Details: left ankle in cast, otherwise LLE [facility]   Balance Balance Balance Assessed: Yes Dynamic Sitting Balance Dynamic Sitting - Balance Support: No upper extremity supported Dynamic Sitting - Level of Assistance: 5: Stand by assistance Dynamic Sitting - Balance Activities:  (tolieting) Dynamic Standing Balance Dynamic Standing - Balance Support:  Bilateral  upper extremity supported Dynamic Standing - Level of Assistance: 4: Min assist  End of Session PT - End of Session Equipment Utilized During Treatment: Gait belt Activity Tolerance: Patient tolerated treatment well Patient left: in chair;with call bell/phone within reach Nurse Communication: Mobility status  GP Functional Assessment Tool Used: CJ Functional Limitation: Mobility: Walking and moving around Mobility: Walking and Moving Around Current Status ([id number]): At least 20 percent but less than 40 percent impaired, limited or restricted Mobility: Walking and Moving Around Goal Status [phone]): At least 1 percent but less than 20 percent impaired, limited or restricted   Georges Mouse 11/05/2012, 9:43 AM

## 2012-11-05 NOTE — Evaluation (Signed)
Occupational Therapy Evaluation Patient Details Name: Barbara Andersen MRN: [id number] DOB: 05-Sep-1975 Today's Date: 11/05/2012 Time: 1137-1200 OT Time Calculation (min): 23 min  OT Assessment / Plan / Recommendation History of present illness left ankle fx with ORIF   Clinical Impression   Pt is recovering from L fibula ORIF.  Requiring min assist for mobility and ADL.  Will follow acutely.    OT Assessment  Patient needs continued OT Services    Follow Up Recommendations  No OT follow up    Barriers to Discharge      Equipment Recommendations  3 in 1 bedside comode;Tub/shower bench;Wheelchair (measurements OT);Wheelchair cushion (measurements OT)    Recommendations for Other Services    Frequency  Min 2X/week    Precautions / Restrictions Precautions Precautions: Fall Required Braces or Orthoses:  (left ankle cast) Restrictions Weight Bearing Restrictions: Yes LLE Weight Bearing: Non weight bearing   Pertinent Vitals/Pain Did not c/o pain, premedicated, elevate L LE    ADL  Eating/Feeding: Independent Where Assessed - Eating/Feeding: Chair Grooming: Wash/dry hands;Min guard Where Assessed - Grooming: Unsupported standing Upper Body Bathing: Set up Where Assessed - Upper Body Bathing: Unsupported sitting Lower Body Bathing: Minimal assistance Where Assessed - Lower Body Bathing: Unsupported sitting;Supported sit to stand Upper Body Dressing: Set up Where Assessed - Upper Body Dressing: Unsupported sitting Lower Body Dressing: Minimal assistance Where Assessed - Lower Body Dressing: Unsupported sitting;Supported sit to stand Toilet Transfer: Minimal assistance Toilet Transfer Method: Sit to stand Toilet Transfer Equipment: Bedside commode Toileting - Clothing Manipulation and Hygiene: Minimal assistance Where Assessed - Engineer, mining and Hygiene: Sit to stand from 3-in-1 or toilet Equipment Used: Rolling walker;Gait belt Transfers/Ambulation  Related to ADLs: min assist with RW ADL Comments: Instructed pt in benefits of 3 in1 and tub transfer bench.  Educated in dressing L LE first.  Pt will rely on daughter if she continues to have difficulty getting pants over feet.  Splinted from toes to knee--no sock or shoe on L.    OT Diagnosis: Generalized weakness;Acute pain  OT Problem List: Decreased activity tolerance;Impaired balance (sitting and/or standing);Decreased knowledge of use of DME or AE;Obesity;Pain OT Treatment Interventions: Self-care/ADL training;DME and/or AE instruction   OT Goals(Current goals can be found in the care plan section) Acute Rehab OT Goals Patient Stated Goal: To be able to manage with her children in w/c OT Goal Formulation: With patient Time For Goal Achievement: 11/12/12 Potential to Achieve Goals: Good  Visit Information  Last OT Received On: 11/05/12 Assistance Needed: +1 History of Present Illness: left ankle fx with ORIF       Prior Functioning     Home Living Family/patient expects to be discharged to:: Private residence Living Arrangements: Children Available Help at Discharge: Family;Friend(s) Type of Home: House Home Access: Stairs to enter Secretary/administrator of Steps: 4 Entrance Stairs-Rails: None Home Layout: One level Home Equipment: None Prior Function Level of Independence: Independent Gait / Transfers Assistance Needed: Independent ADL's / Homemaking Assistance Needed: Indepedent Communication Communication: No difficulties Dominant Hand: Right         Vision/Perception     Cognition  Cognition Arousal/Alertness: Awake/alert Behavior During Therapy: WFL for tasks assessed/performed Overall Cognitive Status: Within Functional Limits for tasks assessed    Extremity/Trunk Assessment Upper Extremity Assessment Upper Extremity Assessment: Overall WFL for tasks assessed Lower Extremity Assessment Lower Extremity Assessment: LLE deficits/detail LLE Deficits  / Details: left ankle in cast, otherwise LLE [facility]     Mobility Bed  Mobility Bed Mobility: Not assessed Supine to Sit: 5: Supervision Transfers Sit to Stand: 4: Min assist Stand to Sit: 4: Min assist     Exercise     Balance Balance Balance Assessed: Yes Dynamic Sitting Balance Dynamic Sitting - Balance Support: No upper extremity supported Dynamic Sitting - Level of Assistance: 5: Stand by assistance Dynamic Sitting - Balance Activities:  (tolieting) Static Standing Balance Static Standing - Balance Support: No upper extremity supported Static Standing - Level of Assistance: 5: Stand by assistance Dynamic Standing Balance Dynamic Standing - Balance Support: Bilateral upper extremity supported Dynamic Standing - Level of Assistance: 4: Min assist   End of Session OT - End of Session Activity Tolerance:  (limited by nausea) Patient left: in chair;with call bell/phone within reach  GO Functional Assessment Tool Used: clinical judgement Functional Limitation: Self care Self Care Current Status ([id number]): At least 20 percent but less than 40 percent impaired, limited or restricted Self Care Goal Status (U0454): At least 1 percent but less than 20 percent impaired, limited or restricted   Evern Bio 11/05/2012, 12:19 PM

## 2012-11-06 NOTE — Progress Notes (Signed)
Physical Therapy Treatment Patient Details Name: Barbara Andersen MRN: [id number] DOB: 02/16/1976 Today's Date: 11/06/2012 Time: 1025-1050 PT Time Calculation (min): 25 min  PT Assessment / Plan / Recommendation  PT Comments   Pt making great progress with mobility today with stair education completed and wheelchair management education completed.   Follow Up Recommendations  Home health PT           Equipment Recommendations  Rolling walker with 5" wheels;Wheelchair (measurements PT);Wheelchair cushion (measurements PT)       Frequency Min 5X/week   Progress towards PT Goals Progress towards PT goals: Progressing toward goals  Plan Current plan remains appropriate    Precautions / Restrictions Precautions Precautions: Fall Required Braces or Orthoses: Other Brace/Splint (left ankle cast) Restrictions Weight Bearing Restrictions: Yes LLE Weight Bearing: Non weight bearing       Mobility  Bed Mobility Bed Mobility: Supine to Sit;Sit to Supine Supine to Sit: 6: Modified independent (Device/Increase time);HOB flat Sit to Supine: 6: Modified independent (Device/Increase time);HOB flat Details for Bed Mobility Assistance: pt able to get self to edge of bed and back lying down without rails/assistance/cues with bed flat. Transfers Transfers: Stand Pivot Transfers Sit to Stand: 5: Supervision;From chair/3-in-1;From bed;With upper extremity assist Stand to Sit: 5: Supervision;To chair/3-in-1;To bed;With upper extremity assist Stand Pivot Transfers: 5: Supervision Details for Transfer Assistance: pt used a RW for stand pivot transfer bed to/from wheelchair with supervision Ambulation/Gait Ambulation/Gait Assistance: 5: Supervision Ambulation Distance (Feet): 15 Feet Assistive device: Rolling walker Ambulation/Gait Assistance Details: maintains NWB on left leg well Gait Pattern: Step-to pattern Stairs: Yes Stairs Assistance: 4: Min assist Stairs Assistance Details (indicate  cue type and reason): to secure walker only with going up/down stairs. pt able to balance self independently. Stair Management Technique: With walker;Backwards Number of Stairs: 3 Wheelchair Mobility Wheelchair Mobility: Yes Wheelchair Assistance: 5: Financial planner Details (indicate cue type and reason): for propuslion >200 feet includng turns Lt/Rt and backing up Occupational hygienist: Both upper extremities Wheelchair Parts Management: Independent      PT Goals (current goals can now be found in the care plan section) Acute Rehab PT Goals Patient Stated Goal: To be able to manage with her children in w/c PT Goal Formulation: With patient Time For Goal Achievement: 11/12/12 Potential to Achieve Goals: Good Additional Goals Additional Goal #1: Propel w/c 150' in home environment mod I- goal met today: KB  Visit Information  Last PT Received On: 11/06/12 Assistance Needed: +1 History of Present Illness: left ankle fx with ORIF    Subjective Data  Patient Stated Goal: To be able to manage with her children in w/c   Cognition  Cognition Behavior During Therapy: [facility] for tasks assessed/performed Overall Cognitive Status: Within Functional Limits for tasks assessed       End of Session PT - End of Session Equipment Utilized During Treatment: Gait belt Activity Tolerance: Patient tolerated treatment well Patient left: in bed;with call bell/phone within reach Nurse Communication: Mobility status   GP     Sallyanne Kuster 11/06/2012, 12:08 PM  Sallyanne Kuster, PTA Office- [phone]

## 2012-11-06 NOTE — Progress Notes (Signed)
Occupational Therapy Treatment Patient Details Name: Barbara Andersen MRN: [id number] DOB: 09-25-1975 Today's Date: 11/06/2012 Time: 1000-1012 OT Time Calculation (min): 12 min  OT Assessment / Plan / Recommendation  OT comments  Pt is making excellent progress.  Pt is able to perform simple ADLs with no LOB at set up assist to modified independent.     Follow Up Recommendations  No OT follow up    Barriers to Discharge       Equipment Recommendations  3 in 1 bedside comode;Tub/shower bench;Wheelchair (measurements OT);Wheelchair cushion (measurements OT)    Recommendations for Other Services    Frequency Min 2X/week   Progress towards OT Goals Progress towards OT goals: Progressing toward goals  Plan Discharge plan remains appropriate    Precautions / Restrictions Precautions Precautions: Fall Required Braces or Orthoses: Other Brace/Splint Restrictions Weight Bearing Restrictions: Yes LLE Weight Bearing: Non weight bearing       ADL  Grooming: Brushing hair;Modified independent Where Assessed - Grooming: Supported standing Lower Body Bathing: Set up Where Assessed - Lower Body Bathing: Supported sit to stand Lower Body Dressing: Set up Where Assessed - Lower Body Dressing: Supported sit to Pharmacist, hospital: Modified independent Statistician Method: Sit to stand;Stand Wellsite geologist: Raised toilet seat with arms (or 3-in-1 over toilet) Toileting - Clothing Manipulation and Hygiene: Modified independent Where Assessed - Glass blower/designer Manipulation and Hygiene: Standing Equipment Used: Rolling walker Transfers/Ambulation Related to ADLs: modified independent ADL Comments: Pt with no LOB with simulated ADLs in standing.  Pt instructed in use of tub transfer bench.  She verbalized understanding, but deferred actual practive.  Discussed with her use of walker bag for home; and how to move items around kitchen safely.  Pt with not further concerns     OT Diagnosis:    OT Problem List:   OT Treatment Interventions:     OT Goals(current goals can now be found in the care plan section) Acute Rehab OT Goals Patient Stated Goal: To be able to manage with her children in w/c ADL Goals Pt Will Perform Grooming: with modified independence;standing Pt Will Perform Lower Body Dressing: with modified independence;sit to/from stand Pt Will Transfer to Toilet: with modified independence;ambulating;bedside commode Pt Will Perform Tub/Shower Transfer: Tub transfer;with modified independence;tub bench  Visit Information  Last OT Received On: 11/06/12 Assistance Needed: +1 History of Present Illness: left ankle fx with ORIF    Subjective Data      Prior Functioning       Cognition  Cognition Arousal/Alertness: Awake/alert Behavior During Therapy: WFL for tasks assessed/performed Overall Cognitive Status: Within Functional Limits for tasks assessed    Mobility  Bed Mobility Bed Mobility: Supine to Sit;Sitting - Scoot to [place] of Bed;Sit to Supine Supine to Sit: 6: Modified independent (Device/Increase time) Sitting - Scoot to Edge of Bed: 6: Modified independent (Device/Increase time) Sit to Supine: 6: Modified independent (Device/Increase time) Details for Bed Mobility Assistance: pt able to get self to edge of bed and back lying down without rails/assistance/cues with bed flat. Transfers Transfers: Sit to Stand;Stand to Sit Sit to Stand: 6: Modified independent (Device/Increase time);With upper extremity assist;From bed Stand to Sit: 6: Modified independent (Device/Increase time);With upper extremity assist;To bed Details for Transfer Assistance: pt used a RW for stand pivot transfer bed to/from wheelchair with supervision    Exercises      Balance Balance Balance Assessed: Yes Dynamic Standing Balance Dynamic Standing - Balance Support: Left upper extremity supported Dynamic Standing -  Level of Assistance: 6: Modified  independent (Device/Increase time) Dynamic Standing - Balance Activities:  (brushing hair; simulated pulling up pants)   End of Session OT - End of Session Equipment Utilized During Treatment: Rolling walker Activity Tolerance: Patient tolerated treatment well Patient left: in bed;with call bell/phone within reach  GO Functional Assessment Tool Used: clinical judgement Functional Limitation: Self care Self Care Current Status (Z6109): At least 20 percent but less than 40 percent impaired, limited or restricted Self Care Goal Status (U0454): At least 1 percent but less than 20 percent impaired, limited or restricted Self Care Discharge Status [phone]): At least 1 percent but less than 20 percent impaired, limited or restricted   Barbara Andersen M 11/06/2012, 12:19 PM

## 2012-11-06 NOTE — Progress Notes (Signed)
Pt. states pain much better managed; received Dilaudid 1 mg IV at 2030, 2 tabs.Percocet at 2240 and this AM at 0510, effective. Pt sleep well. CMS left toes WNL, ace CDI.

## 2012-11-06 NOTE — Progress Notes (Signed)
Patient ID: Barbara Andersen, female   DOB: Jan 07, 1976, 37 y.o.   MRN: [id number] Looks good.  No acute changes.  Can discharge to home today.

## 2012-11-08 NOTE — Care Management Note (Signed)
    Page 1 of 1   11/08/2012     8:14:26 AM   CARE MANAGEMENT NOTE 11/08/2012  Patient:  Barbara Andersen, Barbara Andersen   Account Number:  [id number]  Date Initiated:  11/08/2012  Documentation initiated by:  Jacquelynn Cree  Subjective/Objective Assessment:   admitted for ORIF lt tlibia fracture     Action/Plan:   Anticipated DC Date:     Anticipated DC Plan:        DC Planning Services  CM consult      Choice offered to / List presented to:     DME arranged  3-N-1  WALKER - ROLLING  TUB BENCH  WHEELCHAIR - MANUAL      DME agency  [facility]        Status of service:  Completed, signed off Medicare Important Message given?   (If response is "NO", the following Medicare IM given date fields will be blank) Date Medicare IM given:   Date Additional Medicare IM given:    Discharge Disposition:  HOME/SELF CARE  Per UR Regulation:    If discussed at Long Length of Stay Meetings, dates discussed:    Comments:

## 2013-03-17 ENCOUNTER — Other Ambulatory Visit: Payer: Self-pay

## 2013-07-04 ENCOUNTER — Encounter (HOSPITAL_BASED_OUTPATIENT_CLINIC_OR_DEPARTMENT_OTHER): Payer: Self-pay | Admitting: Emergency Medicine

## 2013-07-04 ENCOUNTER — Emergency Department (HOSPITAL_BASED_OUTPATIENT_CLINIC_OR_DEPARTMENT_OTHER)
Admission: EM | Admit: 2013-07-04 | Discharge: 2013-07-04 | Disposition: A | Discharge status: Home / Self Care | Payer: Medicaid Other | Attending: Emergency Medicine | Admitting: Emergency Medicine

## 2013-07-04 DIAGNOSIS — R197 Diarrhea, unspecified: Secondary | ICD-10-CM | POA: Insufficient documentation

## 2013-07-04 DIAGNOSIS — R059 Cough, unspecified: Secondary | ICD-10-CM

## 2013-07-04 DIAGNOSIS — K219 Gastro-esophageal reflux disease without esophagitis: Secondary | ICD-10-CM | POA: Insufficient documentation

## 2013-07-04 DIAGNOSIS — J029 Acute pharyngitis, unspecified: Secondary | ICD-10-CM | POA: Insufficient documentation

## 2013-07-04 DIAGNOSIS — Z79899 Other long term (current) drug therapy: Secondary | ICD-10-CM | POA: Insufficient documentation

## 2013-07-04 DIAGNOSIS — Z7982 Long term (current) use of aspirin: Secondary | ICD-10-CM | POA: Insufficient documentation

## 2013-07-04 DIAGNOSIS — F319 Bipolar disorder, unspecified: Secondary | ICD-10-CM | POA: Insufficient documentation

## 2013-07-04 DIAGNOSIS — R05 Cough: Secondary | ICD-10-CM

## 2013-07-04 DIAGNOSIS — Z8739 Personal history of other diseases of the musculoskeletal system and connective tissue: Secondary | ICD-10-CM | POA: Insufficient documentation

## 2013-07-04 LAB — RAPID STREP SCREEN (MED CTR MEBANE ONLY): Streptococcus, Group A Screen (Direct): NEGATIVE

## 2013-07-04 MED ORDER — HYDROCODONE-HOMATROPINE 5-1.5 MG/5ML PO SYRP: 5.0000 mL | ORAL_SOLUTION | Freq: Four times a day (QID) | ORAL | Status: DC | PRN

## 2013-07-04 NOTE — ED Notes (Signed)
Sore throat cough and congestion onset Saturday developed chills and fever with some diarrhea yesterday

## 2013-07-04 NOTE — ED Provider Notes (Signed)
Medical screening examination/treatment/procedure(s) were performed by non-physician practitioner and as supervising physician I was immediately available for consultation/collaboration.  EKG Interpretation   None         Sylena Lotter N Renay Crammer, DO 07/04/13 1719 

## 2013-07-04 NOTE — Discharge Instructions (Signed)
Antibiotic Nonuse   Your caregiver felt that the infection or problem was not one that would be helped with an antibiotic.  Infections may be caused by viruses or bacteria. Only a caregiver can tell which one of these is the likely cause of an illness. A cold is the most common cause of infection in both adults and children. A cold is a virus. Antibiotic treatment will have no effect on a viral infection. Viruses can lead to many lost days of work caring for sick children and many missed days of school. Children may catch as many as 10 "colds" or "flus" per year during which they can be tearful, cranky, and uncomfortable. The goal of treating a virus is aimed at keeping the ill person comfortable.  Antibiotics are medications used to help the body fight bacterial infections. There are relatively few types of bacteria that cause infections but there are hundreds of viruses. While both viruses and bacteria cause infection they are very different types of germs. A viral infection will typically go away by itself within 7 to 10 days. Bacterial infections may spread or get worse without antibiotic treatment.  Examples of bacterial infections are:   Sore throats (like strep throat or tonsillitis).   Infection in the lung (pneumonia).   Ear and skin infections.  Examples of viral infections are:   Colds or flus.   Most coughs and bronchitis.   Sore throats not caused by Strep.   Runny noses.  It is often best not to take an antibiotic when a viral infection is the cause of the problem. Antibiotics can kill off the helpful bacteria that we have inside our body and allow harmful bacteria to start growing. Antibiotics can cause side effects such as allergies, nausea, and diarrhea without helping to improve the symptoms of the viral infection. Additionally, repeated uses of antibiotics can cause bacteria inside of our body to become resistant. That resistance can be passed onto harmful bacterial. The next time you have  an infection it may be harder to treat if antibiotics are used when they are not needed. Not treating with antibiotics allows our own immune system to develop and take care of infections more efficiently. Also, antibiotics will work better for us when they are prescribed for bacterial infections.  Treatments for a child that is ill may include:   Give extra fluids throughout the day to stay hydrated.   Get plenty of rest.   Only give your child over-the-counter or prescription medicines for pain, discomfort, or fever as directed by your caregiver.   The use of a cool mist humidifier may help stuffy noses.   Cold medications if suggested by your caregiver.  Your caregiver may decide to start you on an antibiotic if:   The problem you were seen for today continues for a longer length of time than expected.   You develop a secondary bacterial infection.  SEEK MEDICAL CARE IF:   Fever lasts longer than 5 days.   Symptoms continue to get worse after 5 to 7 days or become severe.   Difficulty in breathing develops.   Signs of dehydration develop (poor drinking, rare urinating, dark colored urine).   Changes in behavior or worsening tiredness (listlessness or lethargy).  Document Released: 07/07/2001 Document Revised: 07/21/2011 Document Reviewed: 01/03/2009  [employer] Patient Information 2014 [employer].

## 2013-07-04 NOTE — ED Provider Notes (Signed)
CSN: [id number]     Arrival date & time 07/04/13  1102 History   First MD Initiated Contact with Patient 07/04/13 1103     Chief Complaint  Patient presents with  . sore throat chills      (Consider location/radiation/quality/duration/timing/severity/associated sxs/prior Treatment) HPI Comments: Pt states that times 2 days she has had low grade fever, cough, congestion and some diarrhea. Pt has taken some otc medication without relief. Denies vomiting or abdominal pain. 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 No language interpreter was used.    Past Medical History  Diagnosis Date  . Bipolar 1 disorder   . Depression   . GERD (gastroesophageal reflux disease)   . Sleep apnea   . Disc degeneration   . Ulcer disease    Past Surgical History  Procedure Laterality Date  . Tubal ligation    . Dilation and curettage of uterus      x 2  . Cesarean section    . Kidney stone surgery    . Orif ankle fracture Left 11/04/2012    Dr Ophelia CharterYates  . Orif fibula fracture Left 11/04/2012    Procedure: OPEN REDUCTION INTERNAL FIXATION (ORIF) FIBULA FRACTURE;  Surgeon: Eldred MangesMark C Yates, MD;  Location: [facility] OR;  Service: Orthopedics;  Laterality: Left;  Open Reduction Internal Fixation left Fibula and Syndesmotic Screw   History reviewed. No pertinent family history. History  Substance Use Topics  . Smoking status: Never Smoker   . Smokeless tobacco: Never Used  . Alcohol Use: No   OB History   Grav Para Term Preterm Abortions TAB SAB Ect Mult Living                 Review of Systems  Constitutional: Positive for fever.  Respiratory: Positive for cough.   Cardiovascular: Negative.       Allergies  Bee venom and Fish allergy  Home Medications   Current Outpatient Rx  Name  Route  Sig  Dispense  Refill  . amphetamine-dextroamphetamine (ADDERALL) 20 MG tablet   Oral   Take 20 mg by mouth 2 (two)  times daily.           Marland Kitchen. aspirin EC 325 MG tablet   Oral   Take 1 tablet (325 mg total) by mouth daily.   30 tablet   0   . celecoxib (CELEBREX) 100 MG capsule   Oral   Take 100 mg by mouth every morning.         . gabapentin (NEURONTIN) 100 MG capsule   Oral   Take 100 mg by mouth 3 (three) times daily.          Marland Kitchen. HYDROcodone-homatropine (HYDROMET) 5-1.5 MG/5ML syrup   Oral   Take 5 mLs by mouth every 6 (six) hours as needed for cough.   120 mL   0   . Multiple Vitamins-Minerals (MULTIVITAMINS THER. W/MINERALS) TABS   Oral   Take 1 tablet by mouth every morning.          Marland Kitchen. omeprazole (PRILOSEC) 40 MG capsule   Oral   Take 40 mg by mouth every morning.         . ondansetron (ZOFRAN) 4 MG tablet   Oral   Take 1 tablet (4 mg total) by mouth every 6 (six) hours as needed for nausea.   10 tablet   0   .  oxyCODONE-acetaminophen (PERCOCET) 5-325 MG per tablet   Oral   Take 1-2 tablets by mouth every 6 (six) hours as needed for pain.   10 tablet   0   . oxyCODONE-acetaminophen (PERCOCET) 5-325 MG per tablet   Oral   Take 1-2 tablets by mouth every 4 (four) hours as needed.   60 tablet   0   . sertraline (ZOLOFT) 50 MG tablet   Oral   Take 50 mg by mouth every morning.          BP 128/76  Pulse 104  Temp(Src) 99.1 F (37.3 C) (Oral)  Resp 18  Ht 5\' 8"  (1.727 m)  Wt 280 lb (127.007 kg)  BMI 42.58 kg/m2  SpO2 97%  LMP 07/03/2013 Physical Exam  Nursing note and vitals reviewed. Constitutional: She appears well-developed and well-nourished.  HENT:  Right Ear: External ear normal.  Left Ear: External ear normal.  Mouth/Throat: Posterior oropharyngeal erythema present.  Cardiovascular: Normal rate and regular rhythm.   Pulmonary/Chest: Effort normal and breath sounds normal.  Musculoskeletal: Normal range of motion.    ED Course  Procedures (including critical care time) Labs Review Labs Reviewed  RAPID STREP SCREEN  CULTURE, GROUP A STREP    Imaging Review No results found.  EKG Interpretation   None       MDM   Final diagnoses:  Pharyngitis  Cough    Like viral in nature. Will treat symptomatically    Teressa Lower, NP 07/04/13 [phone]

## 2013-07-07 LAB — CULTURE, GROUP A STREP

## 2014-03-20 ENCOUNTER — Emergency Department (HOSPITAL_COMMUNITY): Payer: Medicaid Other

## 2014-03-20 ENCOUNTER — Encounter (HOSPITAL_COMMUNITY): Payer: Self-pay | Admitting: Emergency Medicine

## 2014-03-20 ENCOUNTER — Emergency Department (HOSPITAL_COMMUNITY)
Admission: EM | Admit: 2014-03-20 | Discharge: 2014-03-20 | Disposition: A | Discharge status: Home / Self Care | Payer: Medicaid Other | Attending: Emergency Medicine | Admitting: Emergency Medicine

## 2014-03-20 DIAGNOSIS — R0602 Shortness of breath: Secondary | ICD-10-CM | POA: Diagnosis not present

## 2014-03-20 DIAGNOSIS — Z7982 Long term (current) use of aspirin: Secondary | ICD-10-CM | POA: Diagnosis not present

## 2014-03-20 DIAGNOSIS — Z3202 Encounter for pregnancy test, result negative: Secondary | ICD-10-CM | POA: Insufficient documentation

## 2014-03-20 DIAGNOSIS — R079 Chest pain, unspecified: Secondary | ICD-10-CM

## 2014-03-20 DIAGNOSIS — Z791 Long term (current) use of non-steroidal anti-inflammatories (NSAID): Secondary | ICD-10-CM | POA: Insufficient documentation

## 2014-03-20 DIAGNOSIS — Y9389 Activity, other specified: Secondary | ICD-10-CM | POA: Insufficient documentation

## 2014-03-20 DIAGNOSIS — Z872 Personal history of diseases of the skin and subcutaneous tissue: Secondary | ICD-10-CM | POA: Diagnosis not present

## 2014-03-20 DIAGNOSIS — R000 Tachycardia, unspecified: Secondary | ICD-10-CM | POA: Diagnosis not present

## 2014-03-20 DIAGNOSIS — E663 Overweight: Secondary | ICD-10-CM | POA: Diagnosis not present

## 2014-03-20 DIAGNOSIS — S29011A Strain of muscle and tendon of front wall of thorax, initial encounter: Secondary | ICD-10-CM | POA: Diagnosis not present

## 2014-03-20 DIAGNOSIS — Z8781 Personal history of (healed) traumatic fracture: Secondary | ICD-10-CM | POA: Diagnosis not present

## 2014-03-20 DIAGNOSIS — Z79899 Other long term (current) drug therapy: Secondary | ICD-10-CM | POA: Insufficient documentation

## 2014-03-20 DIAGNOSIS — G8929 Other chronic pain: Secondary | ICD-10-CM | POA: Diagnosis not present

## 2014-03-20 DIAGNOSIS — K219 Gastro-esophageal reflux disease without esophagitis: Secondary | ICD-10-CM | POA: Diagnosis not present

## 2014-03-20 DIAGNOSIS — F319 Bipolar disorder, unspecified: Secondary | ICD-10-CM | POA: Diagnosis not present

## 2014-03-20 DIAGNOSIS — Y9289 Other specified places as the place of occurrence of the external cause: Secondary | ICD-10-CM | POA: Diagnosis not present

## 2014-03-20 DIAGNOSIS — X58XXXA Exposure to other specified factors, initial encounter: Secondary | ICD-10-CM | POA: Insufficient documentation

## 2014-03-20 DIAGNOSIS — G4730 Sleep apnea, unspecified: Secondary | ICD-10-CM | POA: Diagnosis not present

## 2014-03-20 DIAGNOSIS — Y998 Other external cause status: Secondary | ICD-10-CM | POA: Diagnosis not present

## 2014-03-20 LAB — COMPREHENSIVE METABOLIC PANEL
ALK PHOS: 84 U/L (ref 39–117)
ALT: 36 U/L — AB (ref 0–35)
ANION GAP: 15 (ref 5–15)
AST: 34 U/L (ref 0–37)
Albumin: 4.2 g/dL (ref 3.5–5.2)
BILIRUBIN TOTAL: 0.2 mg/dL — AB (ref 0.3–1.2)
BUN: 9 mg/dL (ref 6–23)
CHLORIDE: 100 meq/L (ref 96–112)
CO2: 22 mEq/L (ref 19–32)
Calcium: 10.1 mg/dL (ref 8.4–10.5)
Creatinine, Ser: 0.56 mg/dL (ref 0.50–1.10)
GFR calc non Af Amer: >90 mL/min (ref >90)
GLUCOSE: 93 mg/dL (ref 70–99)
POTASSIUM: 4.3 meq/L (ref 3.7–5.3)
SODIUM: 137 meq/L (ref 137–147)
Total Protein: 9.1 g/dL — ABNORMAL HIGH (ref 6.0–8.3)

## 2014-03-20 LAB — CBC WITH DIFFERENTIAL/PLATELET
Basophils Absolute: 0 10*3/uL (ref 0.0–0.1)
Basophils Relative: 0 % (ref 0–1)
Eosinophils Absolute: 0.4 10*3/uL (ref 0.0–0.7)
Eosinophils Relative: 4 % (ref 0–5)
HCT: 42.5 % (ref 36.0–46.0)
HEMOGLOBIN: 13.8 g/dL (ref 12.0–15.0)
LYMPHS ABS: 1.5 10*3/uL (ref 0.7–4.0)
LYMPHS PCT: 15 % (ref 12–46)
MCH: 26.1 pg (ref 26.0–34.0)
MCHC: 32.5 g/dL (ref 30.0–36.0)
MCV: 80.5 fL (ref 78.0–100.0)
MONOS PCT: 12 % (ref 3–12)
Monocytes Absolute: 1.2 10*3/uL — ABNORMAL HIGH (ref 0.1–1.0)
NEUTROS ABS: 6.9 10*3/uL (ref 1.7–7.7)
NEUTROS PCT: 69 % (ref 43–77)
Platelets: 257 10*3/uL (ref 150–400)
RBC: 5.28 MIL/uL — AB (ref 3.87–5.11)
RDW: 14.6 % (ref 11.5–15.5)
WBC: 9.9 10*3/uL (ref 4.0–10.5)

## 2014-03-20 LAB — I-STAT TROPONIN, ED
Troponin i, poc: 0.01 ng/mL (ref 0.00–0.08)
Troponin i, poc: 0 ng/mL (ref 0.00–0.08)

## 2014-03-20 LAB — PREGNANCY, URINE: PREG TEST UR: NEGATIVE

## 2014-03-20 LAB — D-DIMER, QUANTITATIVE: D-Dimer, Quant: 0.8 ug/mL-FEU — ABNORMAL HIGH (ref 0.00–0.48)

## 2014-03-20 MED ORDER — KETOROLAC TROMETHAMINE 30 MG/ML IJ SOLN
30.0000 mg | Freq: Once | INTRAMUSCULAR | Status: AC
  Administered 2014-03-20: 30 mg via INTRAVENOUS
  Filled 2014-03-20: qty 1

## 2014-03-20 MED ORDER — IOHEXOL 350 MG/ML SOLN
100.0000 mL | Freq: Once | INTRAVENOUS | Status: AC | PRN
  Administered 2014-03-20: 100 mL via INTRAVENOUS

## 2014-03-20 MED ORDER — IBUPROFEN 800 MG PO TABS
800.0000 mg | ORAL_TABLET | Freq: Three times a day (TID) | ORAL | Status: DC
Start: 2014-03-20 — End: 2016-04-08

## 2014-03-20 MED ORDER — SODIUM CHLORIDE 0.9 % IV BOLUS (SEPSIS)
1000.0000 mL | Freq: Once | INTRAVENOUS | Status: AC
  Administered 2014-03-20: 1000 mL via INTRAVENOUS

## 2014-03-20 MED ORDER — CYCLOBENZAPRINE HCL 10 MG PO TABS: 10.0000 mg | ORAL_TABLET | Freq: Two times a day (BID) | ORAL | Status: DC | PRN

## 2014-03-20 NOTE — ED Provider Notes (Signed)
CSN: [id number]     Arrival date & time 03/20/14  [id number] History   First MD Initiated Contact with Patient 03/20/14 [phone]     Chief Complaint  Patient presents with  . Chest Pain     (Consider location/radiation/quality/duration/timing/severity/associated sxs/prior Treatment) The history is provided by the patient.  Barbara Andersen is a 38 y.o. female hx of bipolar, GERD, sleep apnea, chronic back pain from disc disease here with short of breath and chest pain. Has some left-sided chest pain that radiated to her back yesterday. The pain is constant and sharp. Associated with short of breath and she says that she can't take a deep breath. Denies any fevers or chills. Denies any recent exertion or lifting heavy items. Associated with some dizziness as well. Denies any recent travel history of DVT or PE. She does have significant family history of CAD but no personal history of CAD. Patient doesn't smoke.   Past Medical History  Diagnosis Date  . Bipolar 1 disorder   . Depression   . GERD (gastroesophageal reflux disease)   . Sleep apnea   . Disc degeneration   . Ulcer disease    Past Surgical History  Procedure Laterality Date  . Tubal ligation    . Dilation and curettage of uterus      x 2  . Cesarean section    . Kidney stone surgery    . Orif ankle fracture Left 11/04/2012    Dr Ophelia CharterYates  . Orif fibula fracture Left 11/04/2012    Procedure: OPEN REDUCTION INTERNAL FIXATION (ORIF) FIBULA FRACTURE;  Surgeon: Eldred MangesMark C Yates, MD;  Location: [facility] OR;  Service: Orthopedics;  Laterality: Left;  Open Reduction Internal Fixation left Fibula and Syndesmotic Screw   No family history on file. History  Substance Use Topics  . Smoking status: Never Smoker   . Smokeless tobacco: Never Used  . Alcohol Use: No   OB History    No data available     Review of Systems  Respiratory: Positive for shortness of breath.   Cardiovascular: Positive for chest pain.  All other systems reviewed and are  negative.     Allergies  Aspirin; Bee venom; and Fish allergy  Home Medications   Prior to Admission medications   Medication Sig Start Date End Date Taking? Authorizing Provider  celecoxib (CELEBREX) 100 MG capsule Take 100 mg by mouth every morning.   Yes Historical Provider, MD  gabapentin (NEURONTIN) 100 MG capsule Take 100 mg by mouth 2 (two) times daily.    Yes Historical Provider, MD  Magnesium Cl-Calcium Carbonate (SLOW-MAG PO) Take 2 tablets by mouth at bedtime.   Yes Historical Provider, MD  omeprazole (PRILOSEC) 40 MG capsule Take 40 mg by mouth every morning.   Yes Historical Provider, MD  sertraline (ZOLOFT) 50 MG tablet Take 50 mg by mouth every morning.   Yes Historical Provider, MD  Vitamin D, Ergocalciferol, (DRISDOL) 50000 UNITS CAPS capsule Take 100,000 Units by mouth every 7 (seven) days.    Yes Historical Provider, MD  amphetamine-dextroamphetamine (ADDERALL) 20 MG tablet Take 20 mg by mouth 2 (two) times daily.      Historical Provider, MD  aspirin EC 325 MG tablet Take 1 tablet (325 mg total) by mouth daily. 11/05/12   Wende NeighborsSheila M Vernon, PA-C  HYDROcodone-homatropine (HYDROMET) 5-1.5 MG/5ML syrup Take 5 mLs by mouth every 6 (six) hours as needed for cough. 07/04/13   Teressa LowerVrinda Pickering, NP  Multiple Vitamins-Minerals (MULTIVITAMINS THER. W/MINERALS) TABS Take  1 tablet by mouth every morning.     Historical Provider, MD  ondansetron (ZOFRAN) 4 MG tablet Take 1 tablet (4 mg total) by mouth every 6 (six) hours as needed for nausea. 11/05/12   Sheila M Vernon, PA-C  oxyCODONE-acetaminophen (PERCOCET) 5-325 MG per tablet Take 1-2 tablets by mouth every 6 (six) hours as needed for pain. 11/01/12   Robyn M Hess, PA-C  oxyCODONE-acetaminophen (PERCOCET) 5-325 MG per tablet Take 1-2 tablets by mouth every 4 (four) hours as needed. 11/05/12   Sheila M Vernon, PA-C   BP 113/78 mmHg  Pulse 90  Temp(Src) 98.1 F (36.7 C) (Oral)  Resp 22  SpO2 100%  LMP 02/17/2014 Physical Exam   Constitutional: She is oriented to person, place, and time.  Overweight, anxious   HENT:  Head: Normocephalic.  Mouth/Throat: Oropharynx is clear and moist.  Eyes: Conjunctivae and EOM are normal. Pupils are equal, round, and reactive to light.  Neck: Normal range of motion. Neck supple.  Cardiovascular: Regular rhythm and normal heart sounds.   Mildly tachy   Pulmonary/Chest: Effort normal and breath sounds normal. No respiratory distress. She has no wheezes. She has no rales. She exhibits no tenderness.  Abdominal: Soft. Bowel sounds are normal. She exhibits no distension. There is no tenderness. There is no rebound and no guarding.  Musculoskeletal: Normal range of motion. She exhibits no edema or tenderness.  Neurological: She is alert and oriented to person, place, and time. No cranial nerve deficit. Coordination normal.  Skin: Skin is warm and dry.  Psychiatric: She has a normal mood and affect. Her behavior is normal. Judgment and thought content normal.  Nursing note and vitals reviewed.   ED Course  Procedures (including critical care time) Labs Review Labs Reviewed  CBC WITH DIFFERENTIAL - Abnormal; Notable for the following:    RBC 5.28 (*)    Monocytes Absolute 1.2 (*)    All other components within normal limits  COMPREHENSIVE METABOLIC PANEL - Abnormal; Notable for the following:    Total Protein 9.1 (*)    ALT 36 (*)    Total Bilirubin 0.2 (*)    All other components within normal limits  D-DIMER, QUANTITATIVE - Abnormal; Notable for the following:    D-Dimer, Quant 0.80 (*)    All other components within normal limits  PREGNANCY, URINE  I-STAT TROPOININ, ED  I-STAT TROPOININ, ED    Imaging Review Dg Chest 2 View  03/20/2014   CLINICAL DATA:  Left-sided upper chest pain since yesterday. Nonsmoker. Not taking hypertensive meds.  EXAM: CHEST  2 VIEW  COMPARISON:  None  FINDINGS: The heart size and mediastinal contours are within normal limits. Both lungs are  clear. The visualized skeletal structures are unremarkable.  IMPRESSION: No active cardiopulmonary disease.   Electronically Signed   By: Beth  Brown M.D.   On: 03/20/2014 10:11   Ct Angio Chest Pe W/cm &/or Wo Cm  03/20/2014   CLINICAL DATA:  Left-sided chest [place] yesterday. Shortness o9(709[place] of the central pulmonary arteries demonstrating no pulmonary embolus. The segmental and subsegmental pulmonary arteries are suboptimally opacified making evaluation for  pulmonary embolus nondiagnostic. The main pulmonary artery, right main pulmonary artery and left main pulmonary arteries are normal in size. The heart size is normal. There is no pericardial effusion.  There is a bandlike area of airspace disease in the left lower lobe likely representing atelectasis. There are patchy areas of ground-glass opacities throughout bilateral lungs. There is no focal consolidation, pleural effusion or pneumothorax.  There is no axillary, hilar, or mediastinal adenopathy.  There is no lytic or blastic osseous lesion.  The visualized portions of the upper abdomen are unremarkable.  Review of the MIP images confirms the above findings.  IMPRESSION: 1. There is adequate opacification of the central pulmonary arteries demonstrating no pulmonary embolus. The segmental and subsegmental pulmonary arteries are suboptimally opacified making evaluation for pulmonary embolus nondiagnostic. 2. Patchy ground-glass opacities throughout bilateral lungs which is a nonspecific finding. This can be seen with normal expiration versus  inflammation.   Electronically Signed   By: Elige Ko   On: 03/20/2014 11:22     EKG Interpretation   Date/Time:  Monday March 20 2014 08:57:27 EST Ventricular Rate:  103 PR Interval:  162 QRS Duration: 84 QT Interval:  334 QTC Calculation: 437 R Axis:   16 Text Interpretation:  Sinus tachycardia Low voltage, precordial leads  Abnormal R-wave progression, early transition Baseline wander in lead(s)  V4 No significant change since last tracing Confirmed by Virgilio Broadhead  MD, Khylah Kendra  ([id number]) on 03/20/2014 9:27:18 AM      MDM   Final diagnoses:  Chest pain  Shortness of breath    Barbara Andersen is a 38 y.o. female here with chest pain, SOB. Tachy in the ED. Consider PE vs ACS. Will get d-dimer, trop x 2, CXR.   1:05 PM D-dimer mildly positive. CT showed no large PE but timing is slightly off so can't r/o small PE. Patient not tachy after IVF. Now has more reproducible tenderness L chest. My suspicion for small PE is low. Delta trop neg. Will d/c home with flexeril, motrin.     Richardean Canal, MD 03/20/14 [phone]

## 2014-03-20 NOTE — Discharge Instructions (Signed)
Take motrin for pain.   Take flexeril for muscle spasms.   Follow up with your doctor.   Return to ER if you have severe pain, shortness of breath.

## 2014-03-20 NOTE — ED Notes (Signed)
Pt c/o left side chest pain that started yesterday along with SOB, dizziness, and blurred vision. Pt denies pian anywhere else.

## 2015-12-19 ENCOUNTER — Emergency Department (HOSPITAL_COMMUNITY): Payer: Medicaid Other

## 2015-12-19 ENCOUNTER — Encounter (HOSPITAL_COMMUNITY): Payer: Self-pay | Admitting: *Deleted

## 2015-12-19 ENCOUNTER — Emergency Department (HOSPITAL_COMMUNITY)
Admission: EM | Admit: 2015-12-19 | Discharge: 2015-12-19 | Disposition: A | Discharge status: Home / Self Care | Payer: Medicaid Other | Attending: Emergency Medicine | Admitting: Emergency Medicine

## 2015-12-19 DIAGNOSIS — Z7982 Long term (current) use of aspirin: Secondary | ICD-10-CM | POA: Insufficient documentation

## 2015-12-19 DIAGNOSIS — Y929 Unspecified place or not applicable: Secondary | ICD-10-CM | POA: Insufficient documentation

## 2015-12-19 DIAGNOSIS — M25562 Pain in left knee: Secondary | ICD-10-CM | POA: Insufficient documentation

## 2015-12-19 DIAGNOSIS — W010XXA Fall on same level from slipping, tripping and stumbling without subsequent striking against object, initial encounter: Secondary | ICD-10-CM | POA: Insufficient documentation

## 2015-12-19 DIAGNOSIS — Y990 Civilian activity done for income or pay: Secondary | ICD-10-CM | POA: Insufficient documentation

## 2015-12-19 DIAGNOSIS — Y9389 Activity, other specified: Secondary | ICD-10-CM | POA: Insufficient documentation

## 2015-12-19 MED ORDER — NAPROXEN 500 MG PO TABS
500.0000 mg | ORAL_TABLET | Freq: Once | ORAL | Status: AC
  Administered 2015-12-19: 500 mg via ORAL
  Filled 2015-12-19: qty 1

## 2015-12-19 MED ORDER — NAPROXEN 500 MG PO TABS: 500.0000 mg | ORAL_TABLET | Freq: Two times a day (BID) | ORAL | 0 refills | Status: DC

## 2015-12-19 NOTE — ED Triage Notes (Signed)
Pt reports fell at work yesterday, hurt her left knee, pain increased overnight

## 2015-12-19 NOTE — ED Provider Notes (Signed)
[facility]-EMERGENCY DEPT Provider Note   CSN: [id number] Arrival date & time: 12/19/15  4782  First Provider Contact:  First MD Initiated Contact with Patient 12/19/15 0600        History   Chief Complaint Chief Complaint  Patient presents with  . Knee Injury    HPI Barbara Andersen is a 40 y.o. female.  Patient presents with trauamtic left knee pain.  Patient Was at work yesterday when she tripped and fell backwards landing on her bottom when she heard a pop in her left knee. Denies pain anywhere else. She did not hit her head or lose consciousness. No prior treatment. She is ambulatory.  Knee Pain   This is a new problem. The current episode started yesterday. The problem occurs constantly. The problem has been gradually worsening. The pain is present in the left knee. The pain is moderate. Associated symptoms include limited range of motion. Pertinent negatives include no numbness and no stiffness. The symptoms are aggravated by activity. She has tried nothing for the symptoms. There has been a history of trauma.    Past Medical History:  Diagnosis Date  . Bipolar 1 disorder (HCC)   . Depression   . Disc degeneration   . GERD (gastroesophageal reflux disease)   . Sleep apnea   . Ulcer disease     Patient Active Problem List   Diagnosis Date Noted  . Ankle fracture, left 11/04/2012    Past Surgical History:  Procedure Laterality Date  . CESAREAN SECTION    . DILATION AND CURETTAGE OF UTERUS     x 2  . KIDNEY STONE SURGERY    . ORIF ANKLE FRACTURE Left 11/04/2012   Dr Ophelia Charter  . ORIF FIBULA FRACTURE Left 11/04/2012   Procedure: OPEN REDUCTION INTERNAL FIXATION (ORIF) FIBULA FRACTURE;  Surgeon: Eldred Manges, MD;  Location: [facility] OR;  Service: Orthopedics;  Laterality: Left;  Open Reduction Internal Fixation left Fibula and Syndesmotic Screw  . TUBAL LIGATION      OB History    Gravida Para Term Preterm AB Living   1             SAB TAB Ectopic Multiple Live Births             Home Medications    Prior to Admission medications   Medication Sig Start Date End Date Taking? Authorizing Provider  amphetamine-dextroamphetamine (ADDERALL) 20 MG tablet Take 20 mg by mouth 2 (two) times daily.      Historical Provider, MD  aspirin EC 325 MG tablet Take 1 tablet (325 mg total) by mouth daily. 11/05/12   Maud Deed, PA-C  celecoxib (CELEBREX) 100 MG capsule Take 100 mg by mouth every morning.    Historical Provider, MD  cyclobenzaprine (FLEXERIL) 10 MG tablet Take 1 tablet (10 mg total) by mouth 2 (two) times daily as needed for muscle spasms. 03/20/14   Charlynne Pander, MD  gabapentin (NEURONTIN) 100 MG capsule Take 100 mg by mouth 2 (two) times daily.     Historical Provider, MD  HYDROcodone-homatropine (HYDROMET) 5-1.5 MG/5ML syrup Take 5 mLs by mouth every 6 (six) hours as needed for cough. 07/04/13   Teressa Lower, NP  ibuprofen (ADVIL,MOTRIN) 800 MG tablet Take 1 tablet (800 mg total) by mouth 3 (three) times daily. 03/20/14   Charlynne Pander, MD  Magnesium Cl-Calcium Carbonate (SLOW-MAG PO) Take 2 tablets by mouth at bedtime.    Historical Provider, MD  Multiple Vitamins-Minerals (MULTIVITAMINS THER. W/MINERALS) TABS  Take 1 tablet by mouth every morning.     Historical Provider, MD  naproxen (NAPROSYN) 500 MG tablet Take 1 tablet (500 mg total) by mouth 2 (two) times daily. 12/19/15   Cheri FowlerKayla Trinisha Paget, PA-C  omeprazole (PRILOSEC) 40 MG capsule Take 40 mg by mouth every morning.    Historical Provider, MD  ondansetron (ZOFRAN) 4 MG tablet Take 1 tablet (4 mg total) by mouth every 6 (six) hours as needed for nausea. 11/05/12   Maud DeedSheila Vernon, PA-C  oxyCODONE-acetaminophen (PERCOCET) 5-325 MG per tablet Take 1-2 tablets by mouth every 6 (six) hours as needed for pain. 11/01/12   Kathrynn Speedobyn M Hess, PA-C  oxyCODONE-acetaminophen (PERCOCET) 5-325 MG per tablet Take 1-2 tablets by mouth every 4 (four) hours as needed. 11/05/12   Maud DeedSheila Vernon, PA-C  sertraline (ZOLOFT) 50 MG  tablet Take 50 mg by mouth every morning.    Historical Provider, MD  Vitamin D, Ergocalciferol, (DRISDOL) 50000 UNITS CAPS capsule Take 100,000 Units by mouth every 7 (seven) days.     Historical Provider, MD    Family History No family history on file.  Social History Social History  Substance Use Topics  . Smoking status: Never Smoker  . Smokeless tobacco: Never Used  . Alcohol use No     Allergies   Aspirin; Bee venom; and Fish allergy   Review of Systems Review of Systems  Musculoskeletal: Positive for arthralgias. Negative for myalgias and stiffness.  Skin: Negative for color change and wound.  Neurological: Negative for weakness and numbness.     Physical Exam Updated Vital Signs BP 124/86   Pulse 77   Temp 97.7 F (36.5 C) (Oral)   Resp 18   LMP 12/14/2015 (Exact Date)   SpO2 97%   Physical Exam  Constitutional: She is oriented to person, place, and time. She appears well-developed and well-nourished.  HENT:  Head: Normocephalic and atraumatic.  Right Ear: External ear normal.  Left Ear: External ear normal.  Eyes: Conjunctivae are normal. No scleral icterus.  Neck: No tracheal deviation present.  Pulmonary/Chest: Effort normal. No respiratory distress.  Abdominal: She exhibits no distension.  Musculoskeletal: She exhibits tenderness.       Left knee: She exhibits decreased range of motion and swelling (mild). She exhibits no effusion, no deformity, no erythema, no LCL laxity and no MCL laxity. Tenderness found. Medial joint line and MCL tenderness noted. No lateral joint line, no LCL and no patellar tendon tenderness noted.  Left knee TTP along MCL and medial joint line.  Pain with valgus stress.  Negative anterior/posterior drawer.  Neurological: She is alert and oriented to person, place, and time.  Normal strength and sensation.  Skin: Skin is warm and dry.  Psychiatric: She has a normal mood and affect. Her behavior is normal.     ED Treatments /  Results  Labs (all labs ordered are listed, but only abnormal results are displayed) Labs Reviewed - No data to display  EKG  EKG Interpretation None       Radiology Dg Knee Complete 4 Views Left  Result Date: 12/19/2015 CLINICAL DATA:  Acute onset of medial left knee pain and swelling after fall. Felt knee pop. Initial encounter. EXAM: LEFT KNEE - COMPLETE 4+ VIEW COMPARISON:  None. FINDINGS: There is no evidence of fracture or dislocation. The joint spaces are preserved. No significant degenerative change is seen; the patellofemoral joint is grossly unremarkable in appearance. No significant joint effusion is seen. The visualized soft tissues are normal  in appearance. IMPRESSION: No evidence of fracture or dislocation. If the patient's symptoms persist, MRI could be considered for further evaluation, given the presentation. Electronically Signed   By: Roanna Raider M.D.   On: 12/19/2015 06:18    Procedures Procedures (including critical care time)  Medications Ordered in ED Medications  naproxen (NAPROSYN) tablet 500 mg (not administered)     Initial Impression / Assessment and Plan / ED Course  I have reviewed the triage vital signs and the nursing notes.  Pertinent labs & imaging results that were available during my care of the patient were reviewed by me and considered in my medical decision making (see chart for details).  Clinical Course   Patient presents with left knee pain after hearing a pop when she fell backwards. She is ambulatory. She is neurovascularly intact. She is tender along her MCL and medial joint line. Plain films are negative for acute abnormalities. Patient given knee sleeve and crutches in ED. Home with Naprosyn. Discussed rice treatment. Discussed possible further evaluation with MRI as an outpatient with orthopedics for persistent pain. Return precautions discussed. Patient agrees and acknowledges the above plan for discharge.   Final Clinical  Impressions(s) / ED Diagnoses   Final diagnoses:  Left knee pain    New Prescriptions New Prescriptions   NAPROXEN (NAPROSYN) 500 MG TABLET    Take 1 tablet (500 mg total) by mouth 2 (two) times daily.     Cheri Fowler, PA-C 12/19/15 2841    April Palumbo, MD 12/27/15 2304

## 2015-12-19 NOTE — Discharge Instructions (Signed)
Follow up with orthopedics for persistent pain for possible further evaluation with MRI.

## 2015-12-19 NOTE — ED Notes (Signed)
Patient transported to X-ray 

## 2016-04-08 ENCOUNTER — Emergency Department (HOSPITAL_COMMUNITY)
Admission: EM | Admit: 2016-04-08 | Discharge: 2016-04-08 | Disposition: A | Discharge status: Home / Self Care | Payer: Worker's Compensation | Attending: Emergency Medicine | Admitting: Emergency Medicine

## 2016-04-08 ENCOUNTER — Encounter (HOSPITAL_COMMUNITY): Payer: Self-pay | Admitting: *Deleted

## 2016-04-08 ENCOUNTER — Emergency Department (HOSPITAL_COMMUNITY): Payer: Worker's Compensation

## 2016-04-08 DIAGNOSIS — Y92410 Unspecified street and highway as the place of occurrence of the external cause: Secondary | ICD-10-CM | POA: Insufficient documentation

## 2016-04-08 DIAGNOSIS — Y939 Activity, unspecified: Secondary | ICD-10-CM | POA: Insufficient documentation

## 2016-04-08 DIAGNOSIS — Y999 Unspecified external cause status: Secondary | ICD-10-CM | POA: Diagnosis not present

## 2016-04-08 DIAGNOSIS — Z7982 Long term (current) use of aspirin: Secondary | ICD-10-CM | POA: Insufficient documentation

## 2016-04-08 DIAGNOSIS — Z79899 Other long term (current) drug therapy: Secondary | ICD-10-CM | POA: Diagnosis not present

## 2016-04-08 DIAGNOSIS — G44209 Tension-type headache, unspecified, not intractable: Secondary | ICD-10-CM

## 2016-04-08 DIAGNOSIS — R51 Headache: Secondary | ICD-10-CM | POA: Diagnosis present

## 2016-04-08 MED ORDER — METOCLOPRAMIDE HCL 5 MG/ML IJ SOLN
10.0000 mg | INTRAMUSCULAR | Status: AC
  Administered 2016-04-08: 10 mg via INTRAVENOUS
  Filled 2016-04-08: qty 2

## 2016-04-08 MED ORDER — KETOROLAC TROMETHAMINE 30 MG/ML IJ SOLN
30.0000 mg | Freq: Once | INTRAMUSCULAR | Status: AC
  Administered 2016-04-08: 30 mg via INTRAVENOUS
  Filled 2016-04-08: qty 1

## 2016-04-08 MED ORDER — BUTALBITAL-APAP-CAFFEINE 50-325-40 MG PO TABS: 1.0000 | ORAL_TABLET | Freq: Three times a day (TID) | ORAL | 0 refills | Status: AC | PRN

## 2016-04-08 MED ORDER — DEXAMETHASONE SODIUM PHOSPHATE 10 MG/ML IJ SOLN
10.0000 mg | Freq: Once | INTRAMUSCULAR | Status: AC
Start: 2016-04-08 — End: 2016-04-08
  Administered 2016-04-08: 10 mg via INTRAVENOUS
  Filled 2016-04-08: qty 1

## 2016-04-08 NOTE — Discharge Instructions (Signed)
Take Fioricet as needed for persistent symptoms. Follow-up with your primary care doctor in 1 week. You may return for new or concerning symptoms.
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
## 2016-04-08 NOTE — ED Notes (Signed)
Patient d/c self care.  F/U and medications reviewed.  Patient verbalized understanding.
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
## 2016-12-01 ENCOUNTER — Encounter (HOSPITAL_COMMUNITY): Payer: Self-pay | Admitting: Emergency Medicine

## 2016-12-01 ENCOUNTER — Emergency Department (HOSPITAL_COMMUNITY): Payer: BLUE CROSS/BLUE SHIELD

## 2016-12-01 ENCOUNTER — Emergency Department (HOSPITAL_COMMUNITY)
Admission: EM | Admit: 2016-12-01 | Discharge: 2016-12-01 | Disposition: A | Discharge status: Home / Self Care | Payer: BLUE CROSS/BLUE SHIELD | Attending: Emergency Medicine | Admitting: Emergency Medicine

## 2016-12-01 DIAGNOSIS — R42 Dizziness and giddiness: Secondary | ICD-10-CM

## 2016-12-01 DIAGNOSIS — Z7982 Long term (current) use of aspirin: Secondary | ICD-10-CM | POA: Insufficient documentation

## 2016-12-01 DIAGNOSIS — M79672 Pain in left foot: Secondary | ICD-10-CM | POA: Insufficient documentation

## 2016-12-01 DIAGNOSIS — Z79899 Other long term (current) drug therapy: Secondary | ICD-10-CM | POA: Insufficient documentation

## 2016-12-01 LAB — BASIC METABOLIC PANEL
Anion gap: 6 (ref 5–15)
BUN: 14 mg/dL (ref 6–20)
CHLORIDE: 105 mmol/L (ref 101–111)
CO2: 27 mmol/L (ref 22–32)
CREATININE: 0.74 mg/dL (ref 0.44–1.00)
Calcium: 9.2 mg/dL (ref 8.9–10.3)
GFR calc Af Amer: >60 mL/min (ref >60)
GFR calc non Af Amer: >60 mL/min (ref >60)
Glucose, Bld: 96 mg/dL (ref 65–99)
POTASSIUM: 3.9 mmol/L (ref 3.5–5.1)
SODIUM: 138 mmol/L (ref 135–145)

## 2016-12-01 LAB — URINALYSIS, ROUTINE W REFLEX MICROSCOPIC
Bilirubin Urine: NEGATIVE
GLUCOSE, UA: NEGATIVE mg/dL
HGB URINE DIPSTICK: NEGATIVE
Ketones, ur: NEGATIVE mg/dL
LEUKOCYTES UA: NEGATIVE
Nitrite: NEGATIVE
PH: 5 (ref 5.0–8.0)
Protein, ur: NEGATIVE mg/dL
Specific Gravity, Urine: 1.024 (ref 1.005–1.030)

## 2016-12-01 LAB — CBC
HEMATOCRIT: 39.1 % (ref 36.0–46.0)
Hemoglobin: 12.4 g/dL (ref 12.0–15.0)
MCH: 25.5 pg — AB (ref 26.0–34.0)
MCHC: 31.7 g/dL (ref 30.0–36.0)
MCV: 80.5 fL (ref 78.0–100.0)
Platelets: 272 10*3/uL (ref 150–400)
RBC: 4.86 MIL/uL (ref 3.87–5.11)
RDW: 15.2 % (ref 11.5–15.5)
WBC: 13.2 10*3/uL — AB (ref 4.0–10.5)

## 2016-12-01 MED ORDER — MECLIZINE HCL 25 MG PO TABS
50.0000 mg | ORAL_TABLET | Freq: Once | ORAL | Status: AC
  Administered 2016-12-01: 50 mg via ORAL
  Filled 2016-12-01: qty 2

## 2016-12-01 MED ORDER — SODIUM CHLORIDE 0.9 % IV SOLN
1000.0000 mL | INTRAVENOUS | Status: DC
  Administered 2016-12-01: 1000 mL via INTRAVENOUS

## 2016-12-01 MED ORDER — MECLIZINE HCL 25 MG PO TABS: 25.0000 mg | ORAL_TABLET | Freq: Three times a day (TID) | ORAL | 0 refills | Status: DC | PRN

## 2016-12-01 MED ORDER — SODIUM CHLORIDE 0.9 % IV BOLUS (SEPSIS)
1000.0000 mL | Freq: Once | INTRAVENOUS | Status: AC
  Administered 2016-12-01: 1000 mL via INTRAVENOUS

## 2016-12-01 MED ORDER — MELOXICAM 7.5 MG PO TABS: 7.5000 mg | ORAL_TABLET | Freq: Every day | ORAL | 0 refills | Status: DC

## 2016-12-01 MED ORDER — ONDANSETRON HCL 4 MG PO TABS: 4.0000 mg | ORAL_TABLET | Freq: Four times a day (QID) | ORAL | 0 refills | Status: DC | PRN

## 2016-12-01 NOTE — Discharge Instructions (Signed)
Take Mobic once a day. Do not take other anti-inflammatories at the same time (ibuprofen, Motrin, Aleve, naproxen, Advil). You may supplement with Tylenol as needed for further pain. Follow-up with primary care if ankle pain persists. Use meclizine as needed for dizziness, and Zofran as needed for nausea. Stay well-hydrated. Follow-up with primary care if you're continuing to experience symptoms. Return to the emergency department if you develop fever, chills, weakness, slurred speech, numbness, tingling, or any new or worsening symptoms.

## 2016-12-01 NOTE — ED Provider Notes (Signed)
[facility]-EMERGENCY DEPT Provider Note   CSN: [id number] Arrival date & time: 12/01/16  1237     History   Chief Complaint Chief Complaint  Patient presents with  . Dizziness    HPI GAGE TREIBER is a 41 y.o. female resenting with 2 month history of dizziness and two-week history of left foot pain.  Patient states that for the past 2 months, she's had intermittent dizziness, worse with moving her head to the left and bending forward. Dizziness is described as the room spinning. She denies previous history of dizziness. She came in today because she felt her dizziness was worse. She denies ear pain, ear ringing, vision changes, sinus pressure, headaches, sore throat, chest pain, shortness of breath. She reports tenderness to the occiput with palpation, but no HAs at rest. She reports a family history of brain aneurysms. Patient had a CT head in December 2017 which was negative for any abnormalities. She denies fever, chills, rash, tick bites. She reports a normal appetite. She has some associated nausea while dizzy, but no vomiting. Shee does not have a PCP, but is interested in establishing care.  Patient reports a several week history of left foot pain. Pain is on the lateral dorsal aspect of the foot. She had surgery on this foot for years ago, and had no issues until recently. She reports increased standing at work, 10-12 hours, and she's been taking Aleve with relief. Pain is present when she steps on her foot. While at rest, she has no pain. Full range of motion of ankle and toes without pain. She denies any numbness or tingling.   HPI  Past Medical History:  Diagnosis Date  . Bipolar 1 disorder (HCC)   . Depression   . Disc degeneration   . GERD (gastroesophageal reflux disease)   . Sleep apnea   . Ulcer disease     Patient Active Problem List   Diagnosis Date Noted  . Ankle fracture, left 11/04/2012    Past Surgical History:  Procedure Laterality Date  . CESAREAN  SECTION    . DILATION AND CURETTAGE OF UTERUS     x 2  . KIDNEY STONE SURGERY    . ORIF ANKLE FRACTURE Left 11/04/2012   Dr Ophelia Charter  . ORIF FIBULA FRACTURE Left 11/04/2012   Procedure: OPEN REDUCTION INTERNAL FIXATION (ORIF) FIBULA FRACTURE;  Surgeon: Eldred Manges, MD;  Location: [facility] OR;  Service: Orthopedics;  Laterality: Left;  Open Reduction Internal Fixation left Fibula and Syndesmotic Screw  . TUBAL LIGATION      OB History    Gravida Para Term Preterm AB Living   1             SAB TAB Ectopic Multiple Live Births                   Home Medications    Prior to Admission medications   Medication Sig Start Date End Date Taking? Authorizing Provider  aspirin EC 325 MG tablet Take 1 tablet (325 mg total) by mouth daily. Patient not taking: Reported on 04/08/2016 11/05/12   Maud Deed, PA-C  butalbital-acetaminophen-caffeine (FIORICET, ESGIC) [phone] MG tablet Take 1-2 tablets by mouth every 8 (eight) hours as needed for headache. Patient not taking: Reported on 12/01/2016 04/08/16 04/08/17  Antony Madura, PA-C  meclizine (ANTIVERT) 25 MG tablet Take 1 tablet (25 mg total) by mouth 3 (three) times daily as needed for dizziness. 12/01/16   Ainsley Deakins, PA-C  meloxicam (MOBIC) 7.5 MG  tablet Take 1 tablet (7.5 mg total) by mouth daily. 12/01/16   Kiandre Spagnolo, PA-C  ondansetron (ZOFRAN) 4 MG tablet Take 1 tablet (4 mg total) by mouth every 6 (six) hours as needed for nausea or vomiting. 12/01/16   Latoi Giraldo, PA-C    Family History History reviewed. No pertinent family history.  Social History Social History  Substance Use Topics  . Smoking status: Never Smoker  . Smokeless tobacco: Never Used  . Alcohol use No     Allergies   Aspirin; Bee venom; and Fish allergy   Review of Systems Review of Systems  All other systems reviewed and are negative.    Physical Exam Updated Vital Signs BP (!) 121/59 (BP Location: Left Arm)   Pulse 87   Temp 98.1 F  (36.7 C) (Oral)   Resp 20   LMP 12/01/2016   SpO2 100%   Breastfeeding? Unknown   Physical Exam  Constitutional: She is oriented to person, place, and time. She appears well-developed and well-nourished. No distress.  HENT:  Head: Normocephalic and atraumatic.  Right Ear: Tympanic membrane, external ear and ear canal normal.  Left Ear: Tympanic membrane, external ear and ear canal normal.  Nose: Nose normal. Right sinus exhibits no maxillary sinus tenderness and no frontal sinus tenderness. Left sinus exhibits no maxillary sinus tenderness and no frontal sinus tenderness.  Mouth/Throat: Uvula is midline, oropharynx is clear and moist and mucous membranes are normal.  Eyes: Pupils are equal, round, and reactive to light. Conjunctivae and EOM are normal.  No nystagmus. Head impulse and skew test negative.  Neck: Normal range of motion. Neck supple.  Cardiovascular: Normal rate, regular rhythm and intact distal pulses.   Pulmonary/Chest: Effort normal and breath sounds normal. No respiratory distress. She has no wheezes.  Abdominal: Soft. Bowel sounds are normal. She exhibits no distension. There is no tenderness.  Musculoskeletal: Normal range of motion.  Tenderness to palpation of the lateral dorsal aspect of the foot. Full range of motion of ankle and toes without pain. Sensation intact x4. Pulses intact x4. Color and warmth equal bilaterally. Strength equal 4.  Lymphadenopathy:    She has no cervical adenopathy.  Neurological: She is alert and oriented to person, place, and time. She has normal strength. No cranial nerve deficit or sensory deficit. She displays a negative Romberg sign. GCS eye subscore is 4. GCS verbal subscore is 5. GCS motor subscore is 6.  Patient ambulatory to the bathroom without limp, or needing to hold onto the walls.  Skin: Skin is warm and dry. She is not diaphoretic.  Nursing note and vitals reviewed.    ED Treatments / Results  Labs (all labs ordered  are listed, but only abnormal results are displayed) Labs Reviewed  CBC - Abnormal; Notable for the following:       Result Value   WBC 13.2 (*)    MCH 25.5 (*)    All other components within normal limits  BASIC METABOLIC PANEL  URINALYSIS, ROUTINE W REFLEX MICROSCOPIC    EKG  EKG Interpretation None       Radiology Dg Ankle Complete Left  Result Date: 12/01/2016 CLINICAL DATA:  Prior ORIF of a left distal fibula fracture in 2014. Patient presents with left foot and ankle pain with weight-bearing over the past several days and tenderness to palpation involving the dorsum of the left foot. Patient stands for extended periods of time on her job and states that the foot and ankle nearly gave  way 2-3 times earlier today. No recent injuries. EXAM: LEFT ANKLE COMPLETE - 3+ VIEW COMPARISON:  11/04/2012, 11/01/2012. FINDINGS: Prior ORIF of a distal fibula fracture with normal healing. No evidence of acute or subacute fracture or dislocation. Ankle mortise intact with well-preserved joint space. Dystrophic calcification/ossification involving the interosseous membrane distally. Moderate-sized plantar calcaneal spur. Small enthesopathic spur at the insertion of the Achilles tendon on the posterior calcaneus. IMPRESSION: 1. No acute or subacute osseous abnormality. 2. Prior ORIF of a distal left fibular fracture with normal healing. 3. Moderate-sized plantar calcaneal spur. Electronically Signed   By: Hulan Saas M.D.   On: 12/01/2016 18:17   Dg Foot Complete Left  Result Date: 12/01/2016 CLINICAL DATA:  Patient presents with left foot and ankle pain with weight-bearing over the past several days and tenderness to palpation involving the dorsum of the left foot. Patient stands for extended periods of time on her child and states that the foot and ankle nearly gave way 2-3 times earlier today. No recent injuries. EXAM: LEFT FOOT - COMPLETE 3+ VIEW COMPARISON:  None. FINDINGS: No evidence of acute  or subacute fracture. Well-preserved joint spaces. Well-preserved bone mineral density. Moderate-sized plantar calcaneal spur. Small enthesopathic spur at the insertion of the Achilles tendon on the posterior calcaneus. IMPRESSION: 1. No acute or subacute osseous abnormality. 2. Moderate sized plantar calcaneal spur. Electronically Signed   By: Hulan Saas M.D.   On: 12/01/2016 18:19    Procedures Procedures (including critical care time)  Medications Ordered in ED Medications  sodium chloride 0.9 % bolus 1,000 mL (0 mLs Intravenous Stopped 12/01/16 1913)    Followed by  0.9 %  sodium chloride infusion (0 mLs Intravenous Stopped 12/01/16 1913)  meclizine (ANTIVERT) tablet 50 mg (50 mg Oral Given 12/01/16 1810)     Initial Impression / Assessment and Plan / ED Course  I have reviewed the triage vital signs and the nursing notes.  Pertinent labs & imaging results that were available during my care of the patient were reviewed by me and considered in my medical decision making (see chart for details).     Patient presenting with several month history of dizziness, worse today. Dizziness is worse when she turns her head to the side or when she bends forward. She dissociated nausea, but no vomiting. She denies other associated symptoms. She does not have primary care, and has not had this assessed in the past. CT head and neck pain December 2017 showed no acute abnormalities. Will order IV fluids, and meclizine, and orthostatic vital signs and reassess. Patient is ambulatory without difficulty. HINTS exam negative. Patient presenting with left foot pain 2 weeks. Patient had surgery on this foot about 4 years ago, but recently she has had increased standing and activity on this foot. Pain is only when weightbearing. Patient is ambulatory without a limp. X-rays negative for obvious signs of arthritis, fx or dislocation. Patient has a calcaneal spur. Will have patient take Mobic once daily and  follow-up with primary care.  Pt reports improved dizziness and nausea with meclizine. At this time, doubt central cause for vertigo. Will dc pt with meclizine and instructions to f/u with PCP . Discussed case with attending, Dr. Eudelia Bunch agrees to plan. Discussed finding with pt. Return precautions given. Pt states she understands and agrees to plan.    Final Clinical Impressions(s) / ED Diagnoses   Final diagnoses:  Dizziness  Left foot pain    New Prescriptions Discharge Medication List as of  12/01/2016  6:51 PM    START taking these medications   Details  meclizine (ANTIVERT) 25 MG tablet Take 1 tablet (25 mg total) by mouth 3 (three) times daily as needed for dizziness., Starting Mon 12/01/2016, Print    meloxicam (MOBIC) 7.5 MG tablet Take 1 tablet (7.5 mg total) by mouth daily., Starting Mon 12/01/2016, Print    ondansetron (ZOFRAN) 4 MG tablet Take 1 tablet (4 mg total) by mouth every 6 (six) hours as needed for nausea or vomiting., Starting Mon 12/01/2016, Print         [place]avervilleaccavale, WooldridgeSophia, PA-C 12/01/16 2031    Nira Connardama, Pedro Eduardo, MD 12/02/16 [phone]

## 2016-12-01 NOTE — ED Notes (Signed)
Patient prefers to not have orthostatics completed. She states she feels fine now.

## 2016-12-01 NOTE — ED Triage Notes (Signed)
Pt c/o intermittent dizziness and mental cloudiness x 4 months posterior headache x 2 months. Blurred vision throughout visual field, left eye worse than right. No new symptoms today. Concerned about family hx of brain aneurysms.   Pt also c/o left ankle pain with weight bearing and tenderness with palpation to left dorsal foot. Hx left ankle surgery 4 years ago. States she would like this evaluated while she is here, but her main concern that brings her to ED is dizziness.

## 2016-12-01 NOTE — ED Notes (Signed)
Pt in CT.

## 2017-05-10 ENCOUNTER — Encounter (HOSPITAL_COMMUNITY): Payer: Self-pay | Admitting: Emergency Medicine

## 2017-05-10 ENCOUNTER — Other Ambulatory Visit: Payer: Self-pay

## 2017-05-10 DIAGNOSIS — W25XXXA Contact with sharp glass, initial encounter: Secondary | ICD-10-CM | POA: Diagnosis not present

## 2017-05-10 DIAGNOSIS — Y93G1 Activity, food preparation and clean up: Secondary | ICD-10-CM | POA: Insufficient documentation

## 2017-05-10 DIAGNOSIS — Y929 Unspecified place or not applicable: Secondary | ICD-10-CM | POA: Insufficient documentation

## 2017-05-10 DIAGNOSIS — S6991XA Unspecified injury of right wrist, hand and finger(s), initial encounter: Secondary | ICD-10-CM | POA: Diagnosis present

## 2017-05-10 DIAGNOSIS — Y999 Unspecified external cause status: Secondary | ICD-10-CM | POA: Insufficient documentation

## 2017-05-10 DIAGNOSIS — Z79899 Other long term (current) drug therapy: Secondary | ICD-10-CM | POA: Insufficient documentation

## 2017-05-10 DIAGNOSIS — S61216A Laceration without foreign body of right little finger without damage to nail, initial encounter: Secondary | ICD-10-CM | POA: Diagnosis not present

## 2017-05-10 NOTE — ED Triage Notes (Signed)
Reports washing dishes this evening when a glass broke cutting right pinky finger.

## 2017-05-11 ENCOUNTER — Emergency Department (HOSPITAL_COMMUNITY)
Admission: EM | Admit: 2017-05-11 | Discharge: 2017-05-11 | Disposition: A | Discharge status: Home / Self Care | Payer: BLUE CROSS/BLUE SHIELD | Attending: Emergency Medicine | Admitting: Emergency Medicine

## 2017-05-11 DIAGNOSIS — S61216A Laceration without foreign body of right little finger without damage to nail, initial encounter: Secondary | ICD-10-CM

## 2017-05-11 MED ORDER — TETANUS-DIPHTH-ACELL PERTUSSIS 5-2.5-18.5 LF-MCG/0.5 IM SUSP: 0.5000 mL | Freq: Once | INTRAMUSCULAR | Status: DC

## 2017-05-11 MED ORDER — LIDOCAINE HCL 2 % IJ SOLN
20.0000 mL | Freq: Once | INTRAMUSCULAR | Status: AC
  Administered 2017-05-11: 400 mg via INTRADERMAL
  Filled 2017-05-11: qty 20

## 2017-05-11 MED ORDER — TETANUS-DIPHTH-ACELL PERTUSSIS 5-2.5-18.5 LF-MCG/0.5 IM SUSP
INTRAMUSCULAR | Status: AC
  Filled 2017-05-11: qty 0.5

## 2017-05-11 NOTE — ED Provider Notes (Signed)
[facility]   CSN: [id number] Arrival date & time: 05/10/17  2139     History   Chief Complaint Chief Complaint  Patient presents with  . Laceration    HPI Barbara Andersen is a 41 y.o. female.  HPI Patient presents to the emergency department with a laceration to the right fifth digit.  The patient states that occurred earlier tonight while she was washing dishes.  She states 1 of the glasses broke and cut her on the lateral fifth digit on the right hand.  She states that she controlled the bleeding with direct pressure.  States she did not take any medications prior to arrival patient states that she is unsure of her tetanus status.  Patient denies any numbness or weakness in the finger. Past Medical History:  Diagnosis Date  . Bipolar 1 disorder (HCC)   . Depression   . Disc degeneration   . GERD (gastroesophageal reflux disease)   . Sleep apnea   . Ulcer disease     Patient Active Problem List   Diagnosis Date Noted  . Ankle fracture, left 11/04/2012    Past Surgical History:  Procedure Laterality Date  . CESAREAN SECTION    . DILATION AND CURETTAGE OF UTERUS     x 2  . KIDNEY STONE SURGERY    . ORIF ANKLE FRACTURE Left 11/04/2012   Dr Ophelia CharterYates  . ORIF FIBULA FRACTURE Left 11/04/2012   Procedure: OPEN REDUCTION INTERNAL FIXATION (ORIF) FIBULA FRACTURE;  Surgeon: Eldred MangesMark C Yates, MD;  Location: [facility] OR;  Service: Orthopedics;  Laterality: Left;  Open Reduction Internal Fixation left Fibula and Syndesmotic Screw  . TUBAL LIGATION      OB History    Gravida Para Term Preterm AB Living   1             SAB TAB Ectopic Multiple Live Births                   Home Medications    Prior to Admission medications   Medication Sig Start Date End Date Taking? Authorizing Provider  aspirin EC 325 MG tablet Take 1 tablet (325 mg total) by mouth daily. Patient not taking: Reported on 04/08/2016 11/05/12   Maud DeedVernon, Sheila,  PA-C  meclizine (ANTIVERT) 25 MG tablet Take 1 tablet (25 mg total) by mouth 3 (three) times daily as needed for dizziness. 12/01/16   Caccavale, Sophia, PA-C  meloxicam (MOBIC) 7.5 MG tablet Take 1 tablet (7.5 mg total) by mouth daily. 12/01/16   Caccavale, Sophia, PA-C  ondansetron (ZOFRAN) 4 MG tablet Take 1 tablet (4 mg total) by mouth every 6 (six) hours as needed for nausea or vomiting. 12/01/16   Caccavale, Sophia, PA-C    Family History No family history on file.  Social History Social History   Tobacco Use  . Smoking status: Never Smoker  . Smokeless tobacco: Never Used  Substance Use Topics  . Alcohol use: No  . Drug use: No     Allergies   Aspirin; Bee venom; and Fish allergy   Review of Systems Review of Systems All other systems negative except as documented in the HPI. All pertinent positives and negatives as reviewed in the HPI.  Physical Exam Updated Vital Signs BP (!) 120/95 (BP Location: Left Arm)   Pulse 84   Temp 98.1 F (36.7 C) (Oral)   Resp 18   Ht 5\' 8"  (1.727 m)   Wt (!) 140.6  kg (310 lb)   SpO2 97%   BMI 47.14 kg/m   Physical Exam  Constitutional: She is oriented to person, place, and time. She appears well-developed and well-nourished. No distress.  HENT:  Head: Normocephalic and atraumatic.  Eyes: Pupils are equal, round, and reactive to light.  Pulmonary/Chest: Effort normal.  Musculoskeletal:       Right hand: She exhibits laceration. She exhibits normal range of motion, no tenderness, normal two-point discrimination, normal capillary refill, no deformity and no swelling. Normal sensation noted. Normal strength noted.       Hands: Neurological: She is alert and oriented to person, place, and time.  Skin: Skin is warm and dry.  Psychiatric: She has a normal mood and affect.  Nursing note and vitals reviewed.    ED Treatments / Results  Labs (all labs ordered are listed, but only abnormal results are displayed) Labs Reviewed - No  data to display  EKG  EKG Interpretation None       Radiology No results found.  Procedures Procedures (including critical care time)  Medications Ordered in ED Medications  Tdap (BOOSTRIX) injection 0.5 mL (not administered)  lidocaine (XYLOCAINE) 2 % (with pres) injection 400 mg (400 mg Intradermal Given 05/11/17 0219)     Initial Impression / Assessment and Plan / ED Course  I have reviewed the triage vital signs and the nursing notes.  Pertinent labs & imaging results that were available during my care of the patient were reviewed by me and considered in my medical decision making (see chart for details).     LACERATION REPAIR Performed by: Carlyle Dollyhristopher W Emiley Digiacomo Authorized by: Jamesetta Orleanshristopher W Celesta Funderburk Consent: Verbal consent obtained. Risks and benefits: risks, benefits and alternatives were discussed Consent given by: patient Patient identity confirmed: provided demographic data Prepped and Draped in normal sterile fashion Wound explored  Laceration Location: Right lateral fifth digit.  Laceration Length: 2.5 cm  No Foreign Bodies seen or palpated  Anesthesia: local infiltration  Local anesthetic: lidocaine 2 % without epinephrine  Anesthetic total: 4 ml  Irrigation method: syringe Amount of cleaning: standard  Skin closure: 4-0 Prolene  Number of sutures: 3  Technique: Simple interrupted  Patient tolerance: Patient tolerated the procedure well with no immediate complications.  Patient is advised to return for any signs and symptoms of infection.  Told to keep the area clean and dry.  Told to have the sutures out in 10-14 days.  Patient has no tendinous injury or foreign body noted on examination. Final Clinical Impressions(s) / ED Diagnoses   Final diagnoses:  Laceration of right little finger without foreign body without damage to nail, initial encounter    ED Discharge Orders    None       Charlestine NightLawyer, Jalexa Pifer, PA-C 05/11/17 0222      Ward, Layla MawKristen N, DO 05/11/17 [phone]

## 2017-05-11 NOTE — ED Notes (Signed)
PT states understanding of care given, follow up care. PT ambulated from ED to car with a steady gait.  

## 2017-05-11 NOTE — Discharge Instructions (Signed)
Return here as needed.  Keep the area clean and dry.  Have the sutures out in 10-14 days.  Return for any signs or symptoms of infection.

## 2017-07-19 ENCOUNTER — Emergency Department (HOSPITAL_COMMUNITY): Payer: BLUE CROSS/BLUE SHIELD

## 2017-07-19 ENCOUNTER — Inpatient Hospital Stay (HOSPITAL_COMMUNITY)
Admission: EM | Admit: 2017-07-19 | Discharge: 2017-07-22 | DRG: 871 | Disposition: A | Discharge status: Home / Self Care | Payer: BLUE CROSS/BLUE SHIELD | Attending: Internal Medicine | Admitting: Internal Medicine

## 2017-07-19 ENCOUNTER — Encounter (HOSPITAL_COMMUNITY): Payer: Self-pay | Admitting: *Deleted

## 2017-07-19 ENCOUNTER — Other Ambulatory Visit: Payer: Self-pay

## 2017-07-19 DIAGNOSIS — Z91013 Allergy to seafood: Secondary | ICD-10-CM

## 2017-07-19 DIAGNOSIS — J111 Influenza due to unidentified influenza virus with other respiratory manifestations: Secondary | ICD-10-CM

## 2017-07-19 DIAGNOSIS — R079 Chest pain, unspecified: Secondary | ICD-10-CM | POA: Diagnosis present

## 2017-07-19 DIAGNOSIS — I959 Hypotension, unspecified: Secondary | ICD-10-CM | POA: Diagnosis present

## 2017-07-19 DIAGNOSIS — A419 Sepsis, unspecified organism: Secondary | ICD-10-CM | POA: Diagnosis not present

## 2017-07-19 DIAGNOSIS — J9601 Acute respiratory failure with hypoxia: Secondary | ICD-10-CM | POA: Diagnosis present

## 2017-07-19 DIAGNOSIS — Z91030 Bee allergy status: Secondary | ICD-10-CM

## 2017-07-19 DIAGNOSIS — J40 Bronchitis, not specified as acute or chronic: Secondary | ICD-10-CM | POA: Diagnosis present

## 2017-07-19 DIAGNOSIS — Z886 Allergy status to analgesic agent status: Secondary | ICD-10-CM

## 2017-07-19 DIAGNOSIS — R112 Nausea with vomiting, unspecified: Secondary | ICD-10-CM | POA: Diagnosis present

## 2017-07-19 DIAGNOSIS — F319 Bipolar disorder, unspecified: Secondary | ICD-10-CM | POA: Diagnosis present

## 2017-07-19 DIAGNOSIS — R197 Diarrhea, unspecified: Secondary | ICD-10-CM

## 2017-07-19 DIAGNOSIS — G4733 Obstructive sleep apnea (adult) (pediatric): Secondary | ICD-10-CM | POA: Diagnosis present

## 2017-07-19 DIAGNOSIS — J101 Influenza due to other identified influenza virus with other respiratory manifestations: Secondary | ICD-10-CM | POA: Diagnosis present

## 2017-07-19 DIAGNOSIS — R509 Fever, unspecified: Secondary | ICD-10-CM | POA: Diagnosis not present

## 2017-07-19 DIAGNOSIS — E875 Hyperkalemia: Secondary | ICD-10-CM | POA: Diagnosis present

## 2017-07-19 DIAGNOSIS — Z6841 Body Mass Index (BMI) 40.0 and over, adult: Secondary | ICD-10-CM

## 2017-07-19 DIAGNOSIS — Z8669 Personal history of other diseases of the nervous system and sense organs: Secondary | ICD-10-CM

## 2017-07-19 DIAGNOSIS — J9801 Acute bronchospasm: Secondary | ICD-10-CM | POA: Diagnosis present

## 2017-07-19 DIAGNOSIS — Z9119 Patient's noncompliance with other medical treatment and regimen: Secondary | ICD-10-CM

## 2017-07-19 DIAGNOSIS — R69 Illness, unspecified: Secondary | ICD-10-CM

## 2017-07-19 LAB — CBC
HEMATOCRIT: 41 % (ref 36.0–46.0)
Hemoglobin: 12.8 g/dL (ref 12.0–15.0)
MCH: 25.1 pg — AB (ref 26.0–34.0)
MCHC: 31.2 g/dL (ref 30.0–36.0)
MCV: 80.4 fL (ref 78.0–100.0)
Platelets: 175 10*3/uL (ref 150–400)
RBC: 5.1 MIL/uL (ref 3.87–5.11)
RDW: 14.8 % (ref 11.5–15.5)
WBC: 8.6 10*3/uL (ref 4.0–10.5)

## 2017-07-19 LAB — COMPREHENSIVE METABOLIC PANEL
ALBUMIN: 3.7 g/dL (ref 3.5–5.0)
ALK PHOS: 67 U/L (ref 38–126)
ALT: 33 U/L (ref 14–54)
AST: 29 U/L (ref 15–41)
Anion gap: 11 (ref 5–15)
BILIRUBIN TOTAL: 0.3 mg/dL (ref 0.3–1.2)
BUN: 8 mg/dL (ref 6–20)
CO2: 19 mmol/L — AB (ref 22–32)
Calcium: 8.6 mg/dL — ABNORMAL LOW (ref 8.9–10.3)
Chloride: 105 mmol/L (ref 101–111)
Creatinine, Ser: 0.72 mg/dL (ref 0.44–1.00)
GFR calc Af Amer: >60 mL/min (ref >60)
GFR calc non Af Amer: >60 mL/min (ref >60)
GLUCOSE: 108 mg/dL — AB (ref 65–99)
POTASSIUM: 3.8 mmol/L (ref 3.5–5.1)
Sodium: 135 mmol/L (ref 135–145)
Total Protein: 7.5 g/dL (ref 6.5–8.1)

## 2017-07-19 LAB — URINALYSIS, ROUTINE W REFLEX MICROSCOPIC
BACTERIA UA: NONE SEEN
Bilirubin Urine: NEGATIVE
Glucose, UA: NEGATIVE mg/dL
Ketones, ur: NEGATIVE mg/dL
Leukocytes, UA: NEGATIVE
NITRITE: NEGATIVE
PROTEIN: 30 mg/dL — AB
SPECIFIC GRAVITY, URINE: 1.02 (ref 1.005–1.030)
pH: 5 (ref 5.0–8.0)

## 2017-07-19 LAB — I-STAT CG4 LACTIC ACID, ED: Lactic Acid, Venous: 1.72 mmol/L (ref 0.5–1.9)

## 2017-07-19 LAB — I-STAT BETA HCG BLOOD, ED (MC, WL, AP ONLY): I-stat hCG, quantitative: <5 m[IU]/mL (ref <5)

## 2017-07-19 LAB — LIPASE, BLOOD: Lipase: 23 U/L (ref 11–51)

## 2017-07-19 MED ORDER — KETOROLAC TROMETHAMINE 30 MG/ML IJ SOLN
30.0000 mg | Freq: Once | INTRAMUSCULAR | Status: AC
  Administered 2017-07-19: 30 mg via INTRAVENOUS
  Filled 2017-07-19: qty 1

## 2017-07-19 MED ORDER — ONDANSETRON 4 MG PO TBDP
8.0000 mg | ORAL_TABLET | Freq: Once | ORAL | Status: AC
  Administered 2017-07-19: 8 mg via ORAL
  Filled 2017-07-19: qty 2

## 2017-07-19 MED ORDER — OSELTAMIVIR PHOSPHATE 75 MG PO CAPS: 75.0000 mg | ORAL_CAPSULE | Freq: Two times a day (BID) | ORAL | 0 refills | Status: DC

## 2017-07-19 MED ORDER — ONDANSETRON HCL 4 MG/2ML IJ SOLN
4.0000 mg | Freq: Once | INTRAMUSCULAR | Status: AC
Start: 2017-07-19 — End: 2017-07-19
  Administered 2017-07-19: 4 mg via INTRAVENOUS
  Filled 2017-07-19: qty 2

## 2017-07-19 MED ORDER — PROMETHAZINE HCL 25 MG PO TABS: 25.0000 mg | ORAL_TABLET | Freq: Four times a day (QID) | ORAL | 0 refills | Status: DC | PRN

## 2017-07-19 MED ORDER — ACETAMINOPHEN 650 MG RE SUPP
650.0000 mg | Freq: Once | RECTAL | Status: AC
  Administered 2017-07-19: 650 mg via RECTAL
  Filled 2017-07-19: qty 1

## 2017-07-19 MED ORDER — ACETAMINOPHEN 325 MG PO TABS
650.0000 mg | ORAL_TABLET | Freq: Once | ORAL | Status: AC
  Administered 2017-07-19: 650 mg via ORAL
  Filled 2017-07-19: qty 2

## 2017-07-19 MED ORDER — ALBUTEROL SULFATE HFA 108 (90 BASE) MCG/ACT IN AERS: 1.0000 | INHALATION_SPRAY | Freq: Four times a day (QID) | RESPIRATORY_TRACT | 0 refills | Status: DC | PRN

## 2017-07-19 MED ORDER — IPRATROPIUM-ALBUTEROL 0.5-2.5 (3) MG/3ML IN SOLN
3.0000 mL | Freq: Once | RESPIRATORY_TRACT | Status: AC
  Administered 2017-07-19: 3 mL via RESPIRATORY_TRACT
  Filled 2017-07-19: qty 3

## 2017-07-19 MED ORDER — OXYMETAZOLINE HCL 0.05 % NA SOLN
1.0000 | Freq: Once | NASAL | Status: AC
  Administered 2017-07-19: 1 via NASAL
  Filled 2017-07-19: qty 15

## 2017-07-19 MED ORDER — ONDANSETRON 4 MG PO TBDP
4.0000 mg | ORAL_TABLET | Freq: Once | ORAL | Status: AC
Start: 2017-07-19 — End: 2017-07-19
  Administered 2017-07-19: 4 mg via ORAL
  Filled 2017-07-19: qty 1

## 2017-07-19 MED ORDER — SODIUM CHLORIDE 0.9 % IV BOLUS (SEPSIS)
1000.0000 mL | Freq: Once | INTRAVENOUS | Status: AC
  Administered 2017-07-19: 1000 mL via INTRAVENOUS

## 2017-07-19 NOTE — ED Notes (Signed)
Pt reports that she vomited back her tylenol  From triage

## 2017-07-19 NOTE — ED Provider Notes (Addendum)
[facility] Provider Note   CSN: [id number] Arrival date & time: 07/19/17  1840     History   Chief Complaint Chief Complaint  Patient presents with  . Generalized Body Aches  . fever    HPI Barbara Andersen is a 42 y.o. female with history of GERD, ulcers, sleep apnea, here for evaluation of nausea vomiting diarrhea fevers productive cough causing chest wall pain for 3 days. Has developed nasal congestion, runny nose and body aches in the last day. Has been taking Tylenol flu with minimal relief. 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 Has been taking sips of water and soda to still hydrated.X-ray felt a little bit better this morning and had some eggs however she vomited.  No exertional chest pain, shortness of breath, bili/bloody emesis, abdominal or flank pain, urinary symptoms, bloody diarrhea, melena, hematochezia. No history of lung disease, tobacco use.  HPI  Past Medical History:  Diagnosis Date  . Bipolar 1 disorder (HCC)   . Depression   . Disc degeneration   . GERD (gastroesophageal reflux disease)   . Sleep apnea   . Ulcer disease     Patient Active Problem List   Diagnosis Date Noted  . Sepsis (HCC) 07/20/2017  . Nausea vomiting and diarrhea 07/20/2017  . Chest pain 07/20/2017  . Acute respiratory failure with hypoxia (HCC) 07/20/2017  . Influenza A 07/20/2017  . OSA (obstructive sleep apnea) 07/20/2017  . Morbid obesity (HCC) 07/20/2017  . H/O sleep apnea   . Ankle fracture, left 11/04/2012    Past Surgical History:  Procedure Laterality Date  . CESAREAN SECTION    . DILATION AND CURETTAGE OF UTERUS     x 2  . KIDNEY STONE SURGERY    . ORIF ANKLE FRACTURE Left 11/04/2012   Dr Ophelia Charter  . ORIF FIBULA FRACTURE Left 11/04/2012   Procedure: OPEN REDUCTION INTERNAL FIXATION (ORIF) FIBULA FRACTURE;  Surgeon: Eldred Manges, MD;  Location: [facility] OR;  Service: Orthopedics;  Laterality: Left;  Open Reduction Internal Fixation  left Fibula and Syndesmotic Screw  . TUBAL LIGATION      OB History    Gravida Para Term Preterm AB Living   1             SAB TAB Ectopic Multiple Live Births                   Home Medications    Prior to Admission medications   Medication Sig Start Date End Date Taking? Authorizing Provider  albuterol (PROVENTIL HFA;VENTOLIN HFA) 108 (90 Base) MCG/ACT inhaler Inhale 1-2 puffs into the lungs every 6 (six) hours as needed for wheezing or shortness of breath. 07/19/17   Liberty Handy, PA-C  aspirin EC 325 MG tablet Take 1 tablet (325 mg total) by mouth daily. Patient not taking: Reported on 04/08/2016 11/05/12   Maud Deed, PA-C  meclizine (ANTIVERT) 25 MG tablet Take 1 tablet (25 mg total) by mouth 3 (three) times daily as needed for dizziness. Patient not taking: Reported on 07/19/2017 12/01/16   Caccavale, Sophia, PA-C  meloxicam (MOBIC) 7.5 MG tablet Take 1 tablet (7.5 mg total) by mouth daily. Patient not taking: Reported on 07/19/2017 12/01/16   Caccavale, Sophia, PA-C  ondansetron (ZOFRAN) 4 MG tablet Take 1 tablet (4 mg total) by mouth every 6 (six) hours as needed for nausea or vomiting. Patient not taking: Reported on 07/19/2017 12/01/16   Alveria Apley, PA-C  oseltamivir (TAMIFLU) 75 MG capsule Take 1 capsule (75 mg total) by mouth every 12 (twelve) hours. 07/19/17   Liberty Handy, PA-C  promethazine (PHENERGAN) 25 MG tablet Take 1 tablet (25 mg total) by mouth every 6 (six) hours as needed for nausea or vomiting. 07/19/17   Liberty Handy, PA-C    Family History No family history on file.  Social History Social History   Tobacco Use  . Smoking status: Never Smoker  . Smokeless tobacco: Never Used  Substance Use Topics  . Alcohol use: No  . Drug use: No     Allergies   Aspirin; Bee venom; and Fish allergy   Review of Systems Review of Systems  Constitutional: Positive for chills and fever.  HENT: Positive for congestion, postnasal drip and  rhinorrhea.   Respiratory: Positive for cough.   Gastrointestinal: Positive for diarrhea, nausea and vomiting.  Musculoskeletal: Positive for myalgias.  All other systems reviewed and are negative.    Physical Exam Updated Vital Signs BP (!) 114/56 (BP Location: Left Arm)   Pulse 93   Temp 98.2 F (36.8 C) (Oral)   Resp 20   Ht 5\' 8"  (1.727 m)   Wt (!) 146.1 kg (322 lb)   LMP 07/19/2017   SpO2 97%   BMI 48.96 kg/m   Physical Exam  Constitutional: She is oriented to person, place, and time. She appears well-developed and well-nourished. No distress.  Non toxic  HENT:  Head: Normocephalic and atraumatic.  Nose: Nose normal.  Mouth/Throat: No oropharyngeal exudate.  Moist mucous membranes   Eyes: Conjunctivae and EOM are normal. Pupils are equal, round, and reactive to light.  Neck: Normal range of motion.  Cardiovascular: Normal rate, regular rhythm and intact distal pulses.  No murmur heard. 2+ DP and radial pulses bilaterally. No LE edema.   Pulmonary/Chest: Effort normal and breath sounds normal. No respiratory distress. She has no wheezes. She has no rales.  Abdominal: Soft. Bowel sounds are normal. There is no tenderness.  No G/R/R. No suprapubic or CVA tenderness.   Musculoskeletal: Normal range of motion. She exhibits no deformity.  Neurological: She is alert and oriented to person, place, and time.  Skin: Skin is warm and dry. Capillary refill takes less than 2 seconds.  Psychiatric: She has a normal mood and affect. Her behavior is normal. Judgment and thought content normal.  Nursing note and vitals reviewed.    ED Treatments / Results  Labs (all labs ordered are listed, but only abnormal results are displayed) Labs Reviewed  COMPREHENSIVE METABOLIC PANEL - Abnormal; Notable for the following components:      Result Value   CO2 19 (*)    Glucose, Bld 108 (*)    Calcium 8.6 (*)    All other components within normal limits  CBC - Abnormal; Notable for the  following components:   MCH 25.1 (*)    All other components within normal limits  URINALYSIS, ROUTINE W REFLEX MICROSCOPIC - Abnormal; Notable for the following components:   APPearance HAZY (*)    Hgb urine dipstick LARGE (*)    Protein, ur 30 (*)    Squamous Epithelial / LPF 0-5 (*)    All other components within normal limits  INFLUENZA PANEL BY PCR (TYPE A & B) - Abnormal; Notable for the following components:   Influenza A By PCR POSITIVE (*)    All other components within normal limits  BASIC METABOLIC PANEL - Abnormal; Notable for the following components:  Potassium 3.3 (*)    CO2 17 (*)    Glucose, Bld 136 (*)    Calcium 8.2 (*)    All other components within normal limits  CBC - Abnormal; Notable for the following components:   MCH 25.7 (*)    All other components within normal limits  CULTURE, BLOOD (ROUTINE X 2)  CULTURE, BLOOD (ROUTINE X 2)  C DIFFICILE QUICK SCREEN W PCR REFLEX  LIPASE, BLOOD  TROPONIN I  TROPONIN I  HIV ANTIBODY (ROUTINE TESTING)  LACTIC ACID, PLASMA  PROCALCITONIN  BASIC METABOLIC PANEL  LACTIC ACID, PLASMA  PROCALCITONIN  I-STAT BETA HCG BLOOD, ED ([facility], [facility], [facility] ONLY)  I-STAT CG4 LACTIC ACID, ED    EKG  EKG Interpretation  Date/Time:  Sunday July 19 2017 18:55:30 EDT Ventricular Rate:  136 PR Interval:  146 QRS Duration: 74 QT Interval:  280 QTC Calculation: 421 R Axis:   -12 Text Interpretation:  Sinus tachycardia Inferior infarct , age undetermined Anterior infarct , age undetermined When compared with ECG of 03/20/2014 No significant change was found Confirmed by Samuel Jester [phone]) on 07/20/2017 9:41:43 PM       Radiology Dg Chest 2 View  Result Date: 07/19/2017 CLINICAL DATA:  Body aches.  Vomiting and diarrhea for 2 weeks. EXAM: CHEST - 2 VIEW COMPARISON:  Chest CT, 03/20/2014 FINDINGS: The heart size and mediastinal contours are within normal limits. Both lungs are clear. No pleural effusion or pneumothorax. The  visualized skeletal structures are unremarkable. IMPRESSION: No active cardiopulmonary disease. Electronically Signed   By: Amie Portland M.D.   On: 07/19/2017 20:21   Ct Angio Chest Pe W Or Wo Contrast  Result Date: 07/20/2017 CLINICAL DATA:  42 year old female with shortness of breath. EXAM: CT ANGIOGRAPHY CHEST WITH CONTRAST TECHNIQUE: Multidetector CT imaging of the chest was performed using the standard protocol during bolus administration of intravenous contrast. Multiplanar CT image reconstructions and MIPs were obtained to evaluate the vascular anatomy. CONTRAST:  ISOVUE-370 IOPAMIDOL (ISOVUE-370) INJECTION 76% COMPARISON:  Chest radiograph dated 07/19/2017 and CT dated 03/20/2014 FINDINGS: Cardiovascular: Borderline cardiomegaly. No pericardial effusion. The thoracic aorta is unremarkable. Evaluation of the pulmonary arteries is limited due to respiratory motion artifact and suboptimal opacification of the peripheral branches. No definite large or central pulmonary artery embolus identified. Mediastinum/Nodes: Right hilar adenopathy measures 12 mm in short axis. Top-normal left hilar lymph nodes. No mediastinal adenopathy. Esophagus is grossly unremarkable. Lungs/Pleura: Streaky and linear densities predominantly involving the lung bases most likely atelectasis. There is hazy airspace densities, likely atelectatic changes. No focal consolidation, pleural effusion, or pneumothorax. The central airways are patent. Upper Abdomen: Probable fatty infiltration of the liver. The visualized upper abdomen is otherwise unremarkable. Musculoskeletal: No chest wall abnormality. No acute or significant osseous findings. Review of the MIP images confirms the above findings. IMPRESSION: 1. No large or central pulmonary artery embolus. Evaluation of the peripheral pulmonary arteries is limited due to respiratory motion artifact. 2. Atelectatic changes of the lungs.  No focal consolidation. 3. Mild right hilar  adenopathy of indeterminate etiology or clinical significance. Clinical correlation is recommended. Electronically Signed   By: Elgie Collard M.D.   On: 07/20/2017 03:03    Procedures Procedures (including critical care time)  Medications Ordered in ED Medications  0.9 %  sodium chloride infusion (1,000 mLs Intravenous New Bag/Given 07/20/17 2250)  ondansetron (ZOFRAN-ODT) disintegrating tablet 4 mg (not administered)  levalbuterol (XOPENEX) nebulizer solution 1.25 mg (1.25 mg Nebulization Given 07/20/17 2025)  dextromethorphan-guaiFENesin (MUCINEX DM) 30-600 MG per 12 hr tablet 1 tablet (not administered)  nitroGLYCERIN (NITROSTAT) SL tablet 0.4 mg (not administered)  morphine 4 MG/ML injection 2 mg (2 mg Intravenous Given 07/20/17 0147)  aspirin EC tablet 325 mg (0 mg Oral Hold 07/20/17 0619)  enoxaparin (LOVENOX) injection 75 mg (0 mg Subcutaneous Hold 07/20/17 1312)  acetaminophen (TYLENOL) tablet 650 mg (650 mg Oral Given 07/20/17 2249)    Or  acetaminophen (TYLENOL) suppository 650 mg ( Rectal See Alternative 07/20/17 2249)  zolpidem (AMBIEN) tablet 5 mg (not administered)  famotidine (PEPCID) IVPB 20 mg premix (20 mg Intravenous New Bag/Given 07/20/17 2233)  oseltamivir (TAMIFLU) capsule 75 mg (75 mg Oral Given 07/20/17 2233)  levalbuterol (XOPENEX) nebulizer solution 0.63 mg (0.63 mg Nebulization Given 07/20/17 1623)  levofloxacin (LEVAQUIN) IVPB 500 mg (500 mg Intravenous Transfusing/Transfer 07/20/17 1328)  methylPREDNISolone sodium succinate (SOLU-MEDROL) 125 mg/2 mL injection 60 mg (60 mg Intravenous Given 07/20/17 2234)  ondansetron (ZOFRAN-ODT) disintegrating tablet 8 mg (8 mg Oral Given 07/19/17 1911)  acetaminophen (TYLENOL) tablet 650 mg (650 mg Oral Given 07/19/17 1914)  ondansetron (ZOFRAN-ODT) disintegrating tablet 4 mg (4 mg Oral Given 07/19/17 2055)  acetaminophen (TYLENOL) suppository 650 mg (650 mg Rectal Given 07/19/17 2055)  sodium chloride 0.9 % bolus 1,000 mL (0 mLs  Intravenous Stopped 07/19/17 2331)  ondansetron (ZOFRAN) injection 4 mg (4 mg Intravenous Given 07/19/17 2207)  ipratropium-albuterol (DUONEB) 0.5-2.5 (3) MG/3ML nebulizer solution 3 mL (3 mLs Nebulization Given 07/19/17 2207)  oxymetazoline (AFRIN) 0.05 % nasal spray 1 spray (1 spray Each Nare Given 07/19/17 2207)  ketorolac (TORADOL) 30 MG/ML injection 30 mg (30 mg Intravenous Given 07/19/17 2207)  sodium chloride 0.9 % bolus 1,000 mL (0 mLs Intravenous Stopped 07/20/17 0609)  oseltamivir (TAMIFLU) capsule 75 mg (75 mg Oral Given 07/20/17 0043)  sodium chloride 0.9 % bolus 1,000 mL (0 mLs Intravenous Stopped 07/20/17 0247)  iopamidol (ISOVUE-370) 76 % injection (100 mLs  Contrast Given 07/20/17 0229)     Initial Impression / Assessment and Plan / ED Course  I have reviewed the triage vital signs and the nursing notes.  Pertinent labs & imaging results that were available during my care of the patient were reviewed by me and considered in my medical decision making (see chart for details).  Clinical Course as of Jul 20 2316  Wynelle Link Jul 19, 2017  2133 Re-evaluated pt. States she is still feeling achy, has not been able to keep gingerale down due to nausea. Given persistent tachycardia and fever, and failed PO challenge will insert IV and provide IVF, zofran, toradol to help with symptoms. Duoneb for symptoms control.   [CG]  2217 Plan:  Pt is receiving IV fluids and meds at this time.  She is hypotensive and tachycardic. Normal lactic.  Will monitor vitals and response to treatment.    [HM]  Mon Jul 20, 2017  0028 Discussed with Dr. Clyde Lundborg who will admit  [HM]    Clinical Course User Index [CG] Liberty Handy, PA-C [HM] Muthersbaugh, Dahlia Client, PA-C   Viral syndrome high on differential. Initial VS with fever and tachycardia. Works at day care, known sick contacts. CXR, EKG, labs unremarkable. Large hgb but currently with menstrual cycle.  No evidence of UTI, PNA, AKI, electrolyte abnormalities. Lactic  acid normal. No h/o immunocompromise. SpO2 dropping to 80s with sleep, h/x sleep apnea and non compliant with CPAP.  Re-evaluated patient, has been persistently tachycardic in ED and fever not well controlled with oral/rectal tylenol  due to vomiting. Endorses body aches, nausea and congestion. Will start IV and provide IVF, toradol, duonebs, monitor HR and reassess.   Final Clinical Impressions(s) / ED Diagnoses   Symptoms likely viral/influenza. Labs and imagine reassuring. Pt will be handed off to oncoming EDPA at shift change. Plan is to monitor VS. If improving and pt tolerating PO, hopeful pt will be discharged with zofran, tamiflu.  Discussed this plan with patient who is agreeable and hoping to go home.  Final diagnoses:  Influenza-like illness  H/O sleep apnea    ED Discharge Orders        Ordered    promethazine (PHENERGAN) 25 MG tablet  Every 6 hours PRN     07/19/17 2219    oseltamivir (TAMIFLU) 75 MG capsule  Every 12 hours     07/19/17 2219    albuterol (PROVENTIL HFA;VENTOLIN HFA) 108 (90 Base) MCG/ACT inhaler  Every 6 hours PRN     07/19/17 2219       Liberty HandyGibbons, Kylie Simmonds J, PA-C 07/19/17 2219    Liberty HandyGibbons, Dylana Shaw J, PA-C 07/20/17 2318    Azalia Bilisampos, Kevin, MD 07/21/17 2118

## 2017-07-19 NOTE — ED Notes (Signed)
ED Provider at bedside. 

## 2017-07-19 NOTE — ED Notes (Signed)
Patient transported to X-ray 

## 2017-07-19 NOTE — ED Notes (Signed)
Pt placed on 2L [place] d/t SpO2 sats dropping into the 80s while resting. Pt reports hx of sleep apnea.

## 2017-07-19 NOTE — ED Triage Notes (Signed)
The pt is c//o generalized body aches  n v and diarrhea for  3 days with a fever  Unable to keep med down  lmp now

## 2017-07-19 NOTE — ED Provider Notes (Signed)
Care assumed from Hatborolaudia Gibbons, [place]PA-C.  Please see her full H&P.  In short,  Barbara Andersen is a 42 y.o. female presents for ILI including fever, chills, cough, N/V/D, body aches, rhinorrhea and chest pain with cough onset 3 days ago. 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  Physical Exam  BP (!) 99/54   Pulse (!) 124   Temp (S) (!) 102.3 F (39.1 C) (Oral)   Resp 20   Ht 5\' 8"  (1.727 m)   Wt (!) 146.1 kg (322 lb)   LMP 07/19/2017   SpO2 96%   BMI 48.96 kg/m   Physical Exam  Constitutional: She appears well-developed and well-nourished. No distress.  HENT:  Head: Normocephalic.  Eyes: Conjunctivae are normal. No scleral icterus.  Neck: Normal range of motion.  Cardiovascular: Intact distal pulses. Tachycardia present.  Pulses:      Radial pulses are 1+ on the right side, and 1+ on the left side.  Pulmonary/Chest: Effort normal.  Musculoskeletal: Normal range of motion.  Neurological: She is alert.  Skin: Skin is warm and dry.  Nursing note and vitals reviewed.   ED Course/Procedures   Clinical Course as of Jul 20 625  Wynelle LinkSun Jul 19, 2017  2133 Re-evaluated pt. States she is still feeling achy, has not been able to keep gingerale down due to nausea. Given persistent tachycardia and fever, and failed PO challenge will insert IV and provide IVF, zofran, toradol to help with symptoms. Duoneb for symptoms control.   [CG]  2217 Plan:  Pt is receiving IV fluids and meds at this time.  She is hypotensive and tachycardic. Normal lactic.  Will monitor vitals and response to treatment.    [HM]  Mon Jul 20, 2017  0028 Discussed with Dr. Clyde LundborgNiu who will admit  [HM]    Clinical Course User Index [CG] Liberty HandyGibbons, Claudia J, PA-C [HM] Natosha Bou, Dahlia ClientHannah, PA-C    Procedures  MDM  Pt presents with influenza like illness.  She remains febrile and tachycardic despite antipyretics and fluids.  She continues to feel poorly.  Mild hyperkalemia noted.  Influenza A positive.   Tamiflu given.  Pt will needs observation admission.     Vitals:   07/19/17 2145 07/19/17 2245 07/19/17 2330 07/19/17 2331  BP: (!) 99/54 (!) 111/50 112/62   Pulse: (!) 124 (!) 119 (!) 120   Resp:  18 18   Temp:    (S) (!) 102.1 F (38.9 C)  TempSrc:    (S) Oral  SpO2: 96% 93% 97%   Weight:      Height:         Influenza-like illness  H/O sleep apnea     Barbara Andersen, Barbara Laflam, PA-C 07/20/17 [id number]    Benjiman CorePickering, Nathan, MD 07/22/17 [phone]

## 2017-07-19 NOTE — ED Notes (Signed)
Pt given ice chips and gingerale. Fluids encouraged after zofran administration. Will continue to monitor.

## 2017-07-20 ENCOUNTER — Observation Stay (HOSPITAL_COMMUNITY): Payer: BLUE CROSS/BLUE SHIELD

## 2017-07-20 DIAGNOSIS — J101 Influenza due to other identified influenza virus with other respiratory manifestations: Secondary | ICD-10-CM | POA: Diagnosis present

## 2017-07-20 DIAGNOSIS — R112 Nausea with vomiting, unspecified: Secondary | ICD-10-CM | POA: Diagnosis not present

## 2017-07-20 DIAGNOSIS — R079 Chest pain, unspecified: Secondary | ICD-10-CM | POA: Diagnosis not present

## 2017-07-20 DIAGNOSIS — F319 Bipolar disorder, unspecified: Secondary | ICD-10-CM | POA: Diagnosis present

## 2017-07-20 DIAGNOSIS — J9601 Acute respiratory failure with hypoxia: Secondary | ICD-10-CM | POA: Diagnosis not present

## 2017-07-20 DIAGNOSIS — Z886 Allergy status to analgesic agent status: Secondary | ICD-10-CM | POA: Diagnosis not present

## 2017-07-20 DIAGNOSIS — R197 Diarrhea, unspecified: Secondary | ICD-10-CM

## 2017-07-20 DIAGNOSIS — Z6841 Body Mass Index (BMI) 40.0 and over, adult: Secondary | ICD-10-CM | POA: Diagnosis not present

## 2017-07-20 DIAGNOSIS — Z91013 Allergy to seafood: Secondary | ICD-10-CM | POA: Diagnosis not present

## 2017-07-20 DIAGNOSIS — A419 Sepsis, unspecified organism: Secondary | ICD-10-CM | POA: Diagnosis present

## 2017-07-20 DIAGNOSIS — I959 Hypotension, unspecified: Secondary | ICD-10-CM | POA: Diagnosis present

## 2017-07-20 DIAGNOSIS — R509 Fever, unspecified: Secondary | ICD-10-CM | POA: Diagnosis present

## 2017-07-20 DIAGNOSIS — G4733 Obstructive sleep apnea (adult) (pediatric): Secondary | ICD-10-CM | POA: Diagnosis present

## 2017-07-20 DIAGNOSIS — R69 Illness, unspecified: Secondary | ICD-10-CM | POA: Diagnosis not present

## 2017-07-20 DIAGNOSIS — E875 Hyperkalemia: Secondary | ICD-10-CM | POA: Diagnosis present

## 2017-07-20 DIAGNOSIS — Z9119 Patient's noncompliance with other medical treatment and regimen: Secondary | ICD-10-CM | POA: Diagnosis not present

## 2017-07-20 DIAGNOSIS — Z8669 Personal history of other diseases of the nervous system and sense organs: Secondary | ICD-10-CM | POA: Diagnosis not present

## 2017-07-20 DIAGNOSIS — J9801 Acute bronchospasm: Secondary | ICD-10-CM | POA: Diagnosis present

## 2017-07-20 DIAGNOSIS — J40 Bronchitis, not specified as acute or chronic: Secondary | ICD-10-CM | POA: Diagnosis present

## 2017-07-20 DIAGNOSIS — Z91030 Bee allergy status: Secondary | ICD-10-CM | POA: Diagnosis not present

## 2017-07-20 LAB — BASIC METABOLIC PANEL
Anion gap: 13 (ref 5–15)
BUN: 7 mg/dL (ref 6–20)
CHLORIDE: 105 mmol/L (ref 101–111)
CO2: 17 mmol/L — AB (ref 22–32)
Calcium: 8.2 mg/dL — ABNORMAL LOW (ref 8.9–10.3)
Creatinine, Ser: 0.76 mg/dL (ref 0.44–1.00)
GFR calc Af Amer: >60 mL/min (ref >60)
GFR calc non Af Amer: >60 mL/min (ref >60)
GLUCOSE: 136 mg/dL — AB (ref 65–99)
Potassium: 3.3 mmol/L — ABNORMAL LOW (ref 3.5–5.1)
Sodium: 135 mmol/L (ref 135–145)

## 2017-07-20 LAB — CBC
HCT: 41.2 % (ref 36.0–46.0)
HEMOGLOBIN: 13.1 g/dL (ref 12.0–15.0)
MCH: 25.7 pg — ABNORMAL LOW (ref 26.0–34.0)
MCHC: 31.8 g/dL (ref 30.0–36.0)
MCV: 80.9 fL (ref 78.0–100.0)
Platelets: 153 10*3/uL (ref 150–400)
RBC: 5.09 MIL/uL (ref 3.87–5.11)
RDW: 15 % (ref 11.5–15.5)
WBC: 7.9 10*3/uL (ref 4.0–10.5)

## 2017-07-20 LAB — INFLUENZA PANEL BY PCR (TYPE A & B)
INFLAPCR: POSITIVE — AB
INFLBPCR: NEGATIVE

## 2017-07-20 LAB — HIV ANTIBODY (ROUTINE TESTING W REFLEX): HIV Screen 4th Generation wRfx: NONREACTIVE

## 2017-07-20 LAB — LACTIC ACID, PLASMA: Lactic Acid, Venous: 1.5 mmol/L (ref 0.5–1.9)

## 2017-07-20 LAB — TROPONIN I: Troponin I: <0.03 ng/mL (ref <0.03)

## 2017-07-20 LAB — PROCALCITONIN: Procalcitonin: 0.96 ng/mL

## 2017-07-20 MED ORDER — DM-GUAIFENESIN ER 30-600 MG PO TB12
1.0000 | ORAL_TABLET | Freq: Two times a day (BID) | ORAL | Status: DC | PRN
  Administered 2017-07-21: 1 via ORAL
  Filled 2017-07-20: qty 1

## 2017-07-20 MED ORDER — IOPAMIDOL (ISOVUE-370) INJECTION 76%
INTRAVENOUS | Status: AC
  Administered 2017-07-20: 100 mL
  Filled 2017-07-20: qty 100

## 2017-07-20 MED ORDER — OSELTAMIVIR PHOSPHATE 75 MG PO CAPS
75.0000 mg | ORAL_CAPSULE | Freq: Once | ORAL | Status: AC
  Administered 2017-07-20: 75 mg via ORAL
  Filled 2017-07-20: qty 1

## 2017-07-20 MED ORDER — ONDANSETRON 4 MG PO TBDP: 4.0000 mg | ORAL_TABLET | Freq: Three times a day (TID) | ORAL | Status: DC | PRN

## 2017-07-20 MED ORDER — LEVALBUTEROL HCL 1.25 MG/0.5ML IN NEBU
1.2500 mg | INHALATION_SOLUTION | Freq: Four times a day (QID) | RESPIRATORY_TRACT | Status: DC
  Administered 2017-07-20 – 2017-07-21 (×3): 1.25 mg via RESPIRATORY_TRACT
  Filled 2017-07-20 (×6): qty 0.5

## 2017-07-20 MED ORDER — FAMOTIDINE IN NACL 20-0.9 MG/50ML-% IV SOLN
20.0000 mg | Freq: Two times a day (BID) | INTRAVENOUS | Status: DC
  Administered 2017-07-20 – 2017-07-21 (×4): 20 mg via INTRAVENOUS
  Filled 2017-07-20 (×5): qty 50

## 2017-07-20 MED ORDER — ASPIRIN EC 325 MG PO TBEC
325.0000 mg | DELAYED_RELEASE_TABLET | Freq: Every day | ORAL | Status: DC
  Administered 2017-07-21 – 2017-07-22 (×2): 325 mg via ORAL
  Filled 2017-07-20 (×3): qty 1

## 2017-07-20 MED ORDER — ACETAMINOPHEN 650 MG RE SUPP: 650.0000 mg | Freq: Four times a day (QID) | RECTAL | Status: DC | PRN

## 2017-07-20 MED ORDER — ENOXAPARIN SODIUM 80 MG/0.8ML ~~LOC~~ SOLN
75.0000 mg | SUBCUTANEOUS | Status: DC
  Administered 2017-07-21 – 2017-07-22 (×2): 75 mg via SUBCUTANEOUS
  Filled 2017-07-20 (×3): qty 0.8

## 2017-07-20 MED ORDER — MORPHINE SULFATE (PF) 4 MG/ML IV SOLN
2.0000 mg | INTRAVENOUS | Status: DC | PRN
Start: 2017-07-20 — End: 2017-07-22
  Administered 2017-07-20: 2 mg via INTRAVENOUS
  Filled 2017-07-20: qty 1

## 2017-07-20 MED ORDER — SODIUM CHLORIDE 0.9 % IV SOLN
INTRAVENOUS | Status: DC
  Administered 2017-07-20: 06:00:00 via INTRAVENOUS
  Administered 2017-07-20: 1000 mL via INTRAVENOUS
  Administered 2017-07-21: 10:00:00 via INTRAVENOUS

## 2017-07-20 MED ORDER — LEVOFLOXACIN IN D5W 500 MG/100ML IV SOLN
500.0000 mg | INTRAVENOUS | Status: DC
  Administered 2017-07-20 – 2017-07-21 (×2): 500 mg via INTRAVENOUS
  Filled 2017-07-20 (×2): qty 100

## 2017-07-20 MED ORDER — LEVALBUTEROL HCL 0.63 MG/3ML IN NEBU
0.6300 mg | INHALATION_SOLUTION | RESPIRATORY_TRACT | Status: DC | PRN
  Administered 2017-07-20 (×3): 0.63 mg via RESPIRATORY_TRACT
  Filled 2017-07-20 (×2): qty 3

## 2017-07-20 MED ORDER — NITROGLYCERIN 0.4 MG SL SUBL: 0.4000 mg | SUBLINGUAL_TABLET | SUBLINGUAL | Status: DC | PRN

## 2017-07-20 MED ORDER — ACETAMINOPHEN 325 MG PO TABS
650.0000 mg | ORAL_TABLET | Freq: Four times a day (QID) | ORAL | Status: DC | PRN
  Administered 2017-07-20 – 2017-07-21 (×3): 650 mg via ORAL
  Filled 2017-07-20 (×3): qty 2

## 2017-07-20 MED ORDER — ZOLPIDEM TARTRATE 5 MG PO TABS: 5.0000 mg | ORAL_TABLET | Freq: Every evening | ORAL | Status: DC | PRN

## 2017-07-20 MED ORDER — SODIUM CHLORIDE 0.9 % IV BOLUS (SEPSIS)
1000.0000 mL | Freq: Once | INTRAVENOUS | Status: AC
  Administered 2017-07-20: 1000 mL via INTRAVENOUS

## 2017-07-20 MED ORDER — OSELTAMIVIR PHOSPHATE 75 MG PO CAPS
75.0000 mg | ORAL_CAPSULE | Freq: Two times a day (BID) | ORAL | Status: DC
  Administered 2017-07-20 – 2017-07-22 (×5): 75 mg via ORAL
  Filled 2017-07-20 (×5): qty 1

## 2017-07-20 MED ORDER — METHYLPREDNISOLONE SODIUM SUCC 125 MG IJ SOLR
60.0000 mg | Freq: Two times a day (BID) | INTRAMUSCULAR | Status: DC
  Administered 2017-07-20 – 2017-07-21 (×3): 60 mg via INTRAVENOUS
  Filled 2017-07-20 (×3): qty 2

## 2017-07-20 NOTE — H&P (Addendum)
History and Physical    ANDA SOBOTTA ZOX:[id number] DOB: 19-May-1975 DOA: 07/19/2017  Referring MD/NP/PA:   PCP: Patient, No Pcp Per   Patient coming from:  The patient is coming from home.  At baseline, pt is independent for most of ADL.  Chief Complaint: Nausea, vomiting, diarrhea, abdominal pain, cough, fever, chills, chest pain  HPI: Barbara Andersen is a 42 y.o. female with medical history significant of bipolar disorder, depression, GERD, OSA not on CPAP, ulcers disease, morbid obesity, who presents with nausea, vomiting, diarrhea, abdominal pain, cough, fever, chills, chest pain.  Patient states that she has been being for 3 days. She has fever, chills, nausea, vomiting, diarrhea and abdominal pain. She vomited 8 times today, without blood in the vomitus. She has had 4 watery diarrhea today. Her abdominal pain is located in the epigastric area, constant, 10 out of 10 in severity, radiating to the back. She also reports cough, shortness breath and chest pain. She coughs up dark colored sputum. She also has runny nose, no sore throat. She has chest pain, which is located in left side of chest, constant, 6 out of 10 in severity, dull, nonradiating. Denies tenderness in calf ares. No symptoms of UTI or unilateral weakness.  ED Course: pt was found to have WBC 8.6, lactic acid 1.72, lipase 23, negative pregnancy test, negative urinalysis, creatinine normal, temperature 103, tachycardia, tachypnea, oxygen saturation to 80% on room air, negative chest x-ray. CTA negative for PE. Pending flu PCR. Patient is placed on telemetry bed for observation.  Review of Systems:   General: has fevers, chills, no body weight gain, has poor appetite, has fatigue HEENT: no blurry vision, hearing changes or sore throat Respiratory: has dyspnea, nocoughing, wheezing CV: has chest pain, no palpitations GI: has nausea, vomiting, abdominal pain, diarrhea, no constipation GU: no dysuria, burning on urination,  increased urinary frequency, hematuria  Ext: no leg edema Neuro: no unilateral weakness, numbness, or tingling, no vision change or hearing loss Skin: no rash, no skin tear. MSK: No muscle spasm, no deformity, no limitation of range of movement in spin Heme: No easy bruising.  Travel history: No recent long distant travel.  Allergy:  Allergies  Allergen Reactions  . Aspirin Other (See Comments)    Due to ulcers  . Bee Venom Hives and Swelling  . Fish Allergy Hives and Swelling    Past Medical History:  Diagnosis Date  . Bipolar 1 disorder (HCC)   . Depression   . Disc degeneration   . GERD (gastroesophageal reflux disease)   . Sleep apnea   . Ulcer disease     Past Surgical History:  Procedure Laterality Date  . CESAREAN SECTION    . DILATION AND CURETTAGE OF UTERUS     x 2  . KIDNEY STONE SURGERY    . ORIF ANKLE FRACTURE Left 11/04/2012   Dr Ophelia Charter  . ORIF FIBULA FRACTURE Left 11/04/2012   Procedure: OPEN REDUCTION INTERNAL FIXATION (ORIF) FIBULA FRACTURE;  Surgeon: Eldred Manges, MD;  Location: [facility] OR;  Service: Orthopedics;  Laterality: Left;  Open Reduction Internal Fixation left Fibula and Syndesmotic Screw  . TUBAL LIGATION      Social History:  reports that  has never smoked. she has never used smokeless tobacco. She reports that she does not drink alcohol or use drugs.  Family History: No family history on file.   Prior to Admission medications   Medication Sig Start Date End Date Taking? Authorizing Provider  albuterol (  PROVENTIL HFA;VENTOLIN HFA) 108 (90 Base) MCG/ACT inhaler Inhale 1-2 puffs into the lungs every 6 (six) hours as needed for wheezing or shortness of breath. 07/19/17   Liberty Handy, PA-C  aspirin EC 325 MG tablet Take 1 tablet (325 mg total) by mouth daily. Patient not taking: Reported on 04/08/2016 11/05/12   Maud Deed, PA-C  meclizine (ANTIVERT) 25 MG tablet Take 1 tablet (25 mg total) by mouth 3 (three) times daily as needed for  dizziness. Patient not taking: Reported on 07/19/2017 12/01/16   Caccavale, Sophia, PA-C  meloxicam (MOBIC) 7.5 MG tablet Take 1 tablet (7.5 mg total) by mouth daily. Patient not taking: Reported on 07/19/2017 12/01/16   Caccavale, Sophia, PA-C  ondansetron (ZOFRAN) 4 MG tablet Take 1 tablet (4 mg total) by mouth every 6 (six) hours as needed for nausea or vomiting. Patient not taking: Reported on 07/19/2017 12/01/16   Caccavale, Sophia, PA-C  oseltamivir (TAMIFLU) 75 MG capsule Take 1 capsule (75 mg total) by mouth every 12 (twelve) hours. 07/19/17   Liberty Handy, PA-C  promethazine (PHENERGAN) 25 MG tablet Take 1 tablet (25 mg total) by mouth every 6 (six) hours as needed for nausea or vomiting. 07/19/17   Liberty Handy, PA-C    Physical Exam: Vitals:   07/20/17 0145 07/20/17 0200 07/20/17 0330 07/20/17 0400  BP: 114/73 120/66 (!) 130/92 (!) 144/84  Pulse:  (!) 111 (!) 109 (!) 105  Resp:  20 (!) 21 (!) 28  Temp:      TempSrc:      SpO2:  94% 99% 100%  Weight:      Height:       General: Not in acute distress HEENT:       Eyes: PERRL, EOMI, no scleral icterus.       ENT: No discharge from the ears and nose, no pharynx injection, no tonsillar enlargement.        Neck: No JVD, no bruit, no mass felt. Heme: No neck lymph node enlargement. Cardiac: S1/S2, RRR, No murmurs, No gallops or rubs. Respiratory:  No rales, wheezing, rhonchi or rubs. GI: Soft, nondistended, has tenderness in epigastric area, no rebound pain, no organomegaly, BS present. GU: No hematuria Ext: No pitting leg edema bilaterally. 2+DP/PT pulse bilaterally. Musculoskeletal: No joint deformities, No joint redness or warmth, no limitation of ROM in spin. Skin: No rashes.  Neuro: Alert, oriented X3, cranial nerves II-XII grossly intact, moves all extremities normally.  Psych: Patient is not psychotic, no suicidal or hemocidal ideation.  Labs on Admission: I have personally reviewed following labs and imaging  studies  CBC: Recent Labs  Lab 07/19/17 1913 07/20/17 0059  WBC 8.6 7.9  HGB 12.8 13.1  HCT 41.0 41.2  MCV 80.4 80.9  PLT 175 153   Basic Metabolic Panel: Recent Labs  Lab 07/19/17 1913 07/20/17 0059  NA 135 135  K 3.8 3.3*  CL 105 105  CO2 19* 17*  GLUCOSE 108* 136*  BUN 8 7  CREATININE 0.72 0.76  CALCIUM 8.6* 8.2*   GFR: Estimated Creatinine Clearance: 141.4 mL/min (by C-G formula based on SCr of 0.76 mg/dL). Liver Function Tests: Recent Labs  Lab 07/19/17 1913  AST 29  ALT 33  ALKPHOS 67  BILITOT 0.3  PROT 7.5  ALBUMIN 3.7   Recent Labs  Lab 07/19/17 1913  LIPASE 23   No results for input(s): AMMONIA in the last 168 hours. Coagulation Profile: No results for input(s): INR, PROTIME in the last  168 hours. Cardiac Enzymes: Recent Labs  Lab 07/20/17 0057  TROPONINI <0.03   BNP (last 3 results) No results for input(s): PROBNP in the last 8760 hours. HbA1C: No results for input(s): HGBA1C in the last 72 hours. CBG: No results for input(s): GLUCAP in the last 168 hours. Lipid Profile: No results for input(s): CHOL, HDL, LDLCALC, TRIG, CHOLHDL, LDLDIRECT in the last 72 hours. Thyroid Function Tests: No results for input(s): TSH, T4TOTAL, FREET4, T3FREE, THYROIDAB in the last 72 hours. Anemia Panel: No results for input(s): VITAMINB12, FOLATE, FERRITIN, TIBC, IRON, RETICCTPCT in the last 72 hours. Urine analysis:    Component Value Date/Time   COLORURINE YELLOW 07/19/2017 1911   APPEARANCEUR HAZY (A) 07/19/2017 1911   LABSPEC 1.020 07/19/2017 1911   PHURINE 5.0 07/19/2017 1911   GLUCOSEU NEGATIVE 07/19/2017 1911   HGBUR LARGE (A) 07/19/2017 1911   BILIRUBINUR NEGATIVE 07/19/2017 1911   KETONESUR NEGATIVE 07/19/2017 1911   PROTEINUR 30 (A) 07/19/2017 1911   UROBILINOGEN 0.2 05/06/2010 1312   NITRITE NEGATIVE 07/19/2017 1911   LEUKOCYTESUR NEGATIVE 07/19/2017 1911   Sepsis Labs: @LABRCNTIP (procalcitonin:4,lacticidven:4) )No results found for  this or any previous visit (from the past 240 hour(s)).   Radiological Exams on Admission: Dg Chest 2 View  Result Date: 07/19/2017 CLINICAL DATA:  Body aches.  Vomiting and diarrhea for 2 weeks. EXAM: CHEST - 2 VIEW COMPARISON:  Chest CT, 03/20/2014 FINDINGS: The heart size and mediastinal contours are within normal limits. Both lungs are clear. No pleural effusion or pneumothorax. The visualized skeletal structures are unremarkable. IMPRESSION: No active cardiopulmonary disease. Electronically Signed   By: Amie Portlandavid  Ormond M.D.   On: 07/19/2017 20:21   Ct Angio Chest Pe W Or Wo Contrast  Result Date: 07/20/2017 CLINICAL DATA:  42 year old female with shortness of breath. EXAM: CT ANGIOGRAPHY CHEST WITH CONTRAST TECHNIQUE: Multidetector CT imaging of the chest was performed using the standard protocol during bolus administration of intravenous contrast. Multiplanar CT image reconstructions and MIPs were obtained to evaluate the vascular anatomy. CONTRAST:  100mL ISOVUE-370 IOPAMIDOL (ISOVUE-370) INJECTION 76% COMPARISON:  Chest radiograph dated 07/19/2017 and CT dated 03/20/2014 FINDINGS: Cardiovascular: Borderline cardiomegaly. No pericardial effusion. The thoracic aorta is unremarkable. Evaluation of the pulmonary arteries is limited due to respiratory motion artifact and suboptimal opacification of the peripheral branches. No definite large or central pulmonary artery embolus identified. Mediastinum/Nodes: Right hilar adenopathy measures 12 mm in short axis. Top-normal left hilar lymph nodes. No mediastinal adenopathy. Esophagus is grossly unremarkable. Lungs/Pleura: Streaky and linear densities predominantly involving the lung bases most likely atelectasis. There is hazy airspace densities, likely atelectatic changes. No focal consolidation, pleural effusion, or pneumothorax. The central airways are patent. Upper Abdomen: Probable fatty infiltration of the liver. The visualized upper abdomen is otherwise  unremarkable. Musculoskeletal: No chest wall abnormality. No acute or significant osseous findings. Review of the MIP images confirms the above findings. IMPRESSION: 1. No large or central pulmonary artery embolus. Evaluation of the peripheral pulmonary arteries is limited due to respiratory motion artifact. 2. Atelectatic changes of the lungs.  No focal consolidation. 3. Mild right hilar adenopathy of indeterminate etiology or clinical significance. Clinical correlation is recommended. Electronically Signed   By: Elgie CollardArash  Radparvar M.D.   On: 07/20/2017 03:03     EKG: Independently reviewed.  Sinus rhythm, QTC 421, low voltage, poor R-wave progression, Q-wave in inferior leads.    Assessment/Plan Principal Problem:   Influenza A Active Problems:   Sepsis (HCC)   Nausea vomiting and diarrhea  Chest pain   Acute respiratory failure with hypoxia (HCC)   OSA (obstructive sleep apnea)  Acute respiratory failure with hypoxia and sepsis due to influenza A: Chest x-ray negative. CT angiogram is negative for PE. Flu PCR came back positive for flu a. -will place on tele bed for obs -Nebulizers: Scheduled Atrovent and prn Xopenex nebs - amiflu -Mucinex for cough  -Urine S. pneumococcal antigen -Follow up blood culture x2, sputum culture -will get Procalcitonin and trend lactic acid levels per sepsis protocol. -IVF: 3L of NS bolus in ED, followed by 125 cc/h   Nausea vomiting and diarrhea: Likely due to flu A. symptoms as severe. -will check C diff pcr -IV fluid as above -prn Zofran for nausea  Chest pain: Etiology is not clear. CT angiogram is negative for PE. Patient has low risk of fall ACS. May have demand ischemia secondary to sepsis. -Follow-up troponin 3 -When necessary nitroglycerin, morphine for pain -Aspirin  OSA: -CPAP   DVT ppx: SQ Lovenox Code Status: Full code Family Communication:  Yes, patient's  daughter  at bed side Disposition Plan:  Anticipate discharge back to  previous home environment Consults called:  none Admission status: Obs / tele    Date of Service 07/20/2017    Lorretta Harp [facility] Pager [phone]  If 7PM-7AM, please contact night-coverage [email or web address] Password [facility] 07/20/2017, 4:12 AM

## 2017-07-20 NOTE — ED Notes (Signed)
Pt given ginger ale per request

## 2017-07-20 NOTE — Progress Notes (Signed)
Patient arrived on unit from the ED at 1500. Orders reviewed and implemented. Patient in bed comfortable and call bell in place.   Jaynee EaglesLaura Emilie Carp RN

## 2017-07-20 NOTE — ED Notes (Signed)
Patient transported to CT 

## 2017-07-20 NOTE — Progress Notes (Signed)
   Follow Up Note  HPI: 42 year old female past history of obesity and obstructive sleep apnea who presented to the emergency room with complaints of several days of progressively worsening shortness of breath, dyspnea on exertion and nausea vomiting diarrhea. Found to have influenza A as well as acute respiratory failure with hypoxia. Started on Tamiflu, nebulizers, oxygen.  Pt admitted earlier this morning.  Seen after arrived to floor.    Exam: ZO:[id number]:regular rate and rhythm, S1-S2 Lungs:bilateral respiratory wheezes Extremities: No clubbing or cyanosis or edema abdoment:soft, nontender, nondistended, positive bowel sounds  Present on Admission: Morbid obesity: Patient meets criteria with BMI greater than 40 . Nausea vomiting and diarrhea: secondary to flu, improving . Chest pain: troponins negative. Likely more bronchospasm . Acute respiratory failure with hypoxia (HCC): supplemental oxygen. Secondary to fluid as well as bronchitis. Noted elevated pro calcitonin levels, so started antibiotics as well. IV steroids . Influenza A: have started Tamiflu . OSA (obstructive sleep apnea): Nightly CPAP   Disposition: potential discharge in next 1-2 days once off oxygen

## 2017-07-20 NOTE — ED Notes (Signed)
Pt placed on hosptial bed for comfort, purewick placed as well.

## 2017-07-21 LAB — PROCALCITONIN: PROCALCITONIN: 0.75 ng/mL

## 2017-07-21 LAB — C DIFFICILE QUICK SCREEN W PCR REFLEX
C DIFFICILE (CDIFF) INTERP: NOT DETECTED
C DIFFICILE (CDIFF) TOXIN: NEGATIVE
C DIFFICLE (CDIFF) ANTIGEN: NEGATIVE

## 2017-07-21 LAB — BASIC METABOLIC PANEL
Anion gap: 8 (ref 5–15)
BUN: 10 mg/dL (ref 6–20)
CHLORIDE: 108 mmol/L (ref 101–111)
CO2: 22 mmol/L (ref 22–32)
Calcium: 8.4 mg/dL — ABNORMAL LOW (ref 8.9–10.3)
Creatinine, Ser: 0.67 mg/dL (ref 0.44–1.00)
GFR calc Af Amer: >60 mL/min (ref >60)
GFR calc non Af Amer: >60 mL/min (ref >60)
GLUCOSE: 151 mg/dL — AB (ref 65–99)
POTASSIUM: 4.2 mmol/L (ref 3.5–5.1)
Sodium: 138 mmol/L (ref 135–145)

## 2017-07-21 LAB — GLUCOSE, CAPILLARY: GLUCOSE-CAPILLARY: 134 mg/dL — AB (ref 65–99)

## 2017-07-21 LAB — LACTIC ACID, PLASMA: Lactic Acid, Venous: 1 mmol/L (ref 0.5–1.9)

## 2017-07-21 MED ORDER — LEVALBUTEROL HCL 1.25 MG/0.5ML IN NEBU: 1.2500 mg | INHALATION_SOLUTION | Freq: Two times a day (BID) | RESPIRATORY_TRACT | Status: DC

## 2017-07-21 MED ORDER — LEVALBUTEROL HCL 1.25 MG/0.5ML IN NEBU
1.2500 mg | INHALATION_SOLUTION | Freq: Two times a day (BID) | RESPIRATORY_TRACT | Status: DC
  Administered 2017-07-21 – 2017-07-22 (×3): 1.25 mg via RESPIRATORY_TRACT
  Filled 2017-07-21 (×3): qty 0.5

## 2017-07-21 MED ORDER — AZITHROMYCIN 500 MG PO TABS
500.0000 mg | ORAL_TABLET | Freq: Every day | ORAL | Status: DC
  Administered 2017-07-22: 500 mg via ORAL
  Filled 2017-07-21: qty 1

## 2017-07-21 MED ORDER — METHYLPREDNISOLONE SODIUM SUCC 40 MG IJ SOLR
40.0000 mg | Freq: Two times a day (BID) | INTRAMUSCULAR | Status: DC
  Administered 2017-07-21: 40 mg via INTRAVENOUS
  Filled 2017-07-21: qty 1

## 2017-07-21 NOTE — Progress Notes (Signed)
PROGRESS NOTE  RAUL WINTERHALTER WUJ:[id number] DOB: 1975/08/27 DOA: 07/19/2017 PCP: Patient, No Pcp Per  HPI/Recap of past 61 hours: 42 year old female past history of obesity and obstructive sleep apnea who presented to the emergency room with complaints of several days of progressively worsening shortness of breath, dyspnea on exertion and nausea vomiting diarrhea. Found to have influenza A as well as acute respiratory failure with hypoxia. Started on Tamiflu, nebulizers, oxygen.   This morning, patient doing little bit better.  Requiring only 3 L nasal cannula today.  Less wheezing and coughing    Assessment/Plan: Principal Problem: Acute respiratory failure with hypoxia secondary to influenza A: Much improving.  Less oxygenation.  Ambulate, check pulse ox.  Hoping to discharge in the next 24 hours.  Continue antibiotics given elevated pro calcitonin levels.  Trend down IV steroids. Active Problems:   Sepsis (HCC)   Nausea vomiting and diarrhea: Initially at home secondary to flu.  Resolved   Chest pain: Likely more bronchospasm.  Troponin is negative.    OSA (obstructive sleep apnea): Nightly CPAP   Morbid obesity (HCC): Meets criteria with BMI greater than 40 nightly    Code Status: Full code   Family Communication: Daughter at the bedside  Disposition Plan: Potential discharge tomorrow if can be weaned off oxygen   Consultants:  None  Procedures:  None  Antimicrobials:  Tamiflu 3/11-present  Levaquin 3/11-present  DVT prophylaxis:  Lovenox   Objective: Vitals:   07/21/17 0216 07/21/17 0446 07/21/17 0809 07/21/17 1628  BP:  (!) 97/45 (!) 108/55 (!) 102/54  Pulse: 90 63 72 84  Resp: 16 20 18 18   Temp:  98 F (36.7 C) 97.7 F (36.5 C) 98.2 F (36.8 C)  TempSrc:  Oral Oral Oral  SpO2:   92% 93%  Weight:      Height:        Intake/Output Summary (Last 24 hours) at 07/21/2017 1701 Last data filed at 07/21/2017 1343 Gross per 24 hour  Intake 2695 ml    Output 850 ml  Net 1845 ml   Filed Weights   07/19/17 1859  Weight: (!) 146.1 kg (322 lb)   Body mass index is 48.96 kg/m.  Exam:   General: Alert and oriented x3, no acute distress  Cardiovascular: Regular rate and rhythm, S1-S2  Respiratory: Clear to auscultation bilaterally, no expiratory wheezing  Abdomen: Soft, obese, nontender, positive bowel sounds  Musculoskeletal: No clubbing or cyanosis, trace pitting edema  Skin: No skin breaks, tears or lesions  Psychiatry: Appropriate, no evidence of psychosis   Data Reviewed: CBC: Recent Labs  Lab 07/19/17 1913 07/20/17 0059  WBC 8.6 7.9  HGB 12.8 13.1  HCT 41.0 41.2  MCV 80.4 80.9  PLT 175 153   Basic Metabolic Panel: Recent Labs  Lab 07/19/17 1913 07/20/17 0059 07/21/17 0435  NA 135 135 138  K 3.8 3.3* 4.2  CL 105 105 108  CO2 19* 17* 22  GLUCOSE 108* 136* 151*  BUN 8 7 10   CREATININE 0.72 0.76 0.67  CALCIUM 8.6* 8.2* 8.4*   GFR: Estimated Creatinine Clearance: 141.4 mL/min (by C-G formula based on SCr of 0.67 mg/dL). Liver Function Tests: Recent Labs  Lab 07/19/17 1913  AST 29  ALT 33  ALKPHOS 67  BILITOT 0.3  PROT 7.5  ALBUMIN 3.7   Recent Labs  Lab 07/19/17 1913  LIPASE 23   No results for input(s): AMMONIA in the last 168 hours. Coagulation Profile: No results for input(s): INR,  PROTIME in the last 168 hours. Cardiac Enzymes: Recent Labs  Lab 07/20/17 0057 07/20/17 1438  TROPONINI <0.03 <0.03   BNP (last 3 results) No results for input(s): PROBNP in the last 8760 hours. HbA1C: No results for input(s): HGBA1C in the last 72 hours. CBG: Recent Labs  Lab 07/21/17 0724  GLUCAP 134*   Lipid Profile: No results for input(s): CHOL, HDL, LDLCALC, TRIG, CHOLHDL, LDLDIRECT in the last 72 hours. Thyroid Function Tests: No results for input(s): TSH, T4TOTAL, FREET4, T3FREE, THYROIDAB in the last 72 hours. Anemia Panel: No results for input(s): VITAMINB12, FOLATE, FERRITIN,  TIBC, IRON, RETICCTPCT in the last 72 hours. Urine analysis:    Component Value Date/Time   COLORURINE YELLOW 07/19/2017 1911   APPEARANCEUR HAZY (A) 07/19/2017 1911   LABSPEC 1.020 07/19/2017 1911   PHURINE 5.0 07/19/2017 1911   GLUCOSEU NEGATIVE 07/19/2017 1911   HGBUR LARGE (A) 07/19/2017 1911   BILIRUBINUR NEGATIVE 07/19/2017 1911   KETONESUR NEGATIVE 07/19/2017 1911   PROTEINUR 30 (A) 07/19/2017 1911   UROBILINOGEN 0.2 05/06/2010 1312   NITRITE NEGATIVE 07/19/2017 1911   LEUKOCYTESUR NEGATIVE 07/19/2017 1911   Sepsis Labs: @LABRCNTIP (procalcitonin:4,lacticidven:4)  ) Recent Results (from the past 240 hour(s))  Culture, blood (x 2)     Status: None (Preliminary result)   Collection Time: 07/20/17  1:11 AM  Result Value Ref Range Status   Specimen Description BLOOD RIGHT HAND  Final   Special Requests IN PEDIATRIC BOTTLE Blood Culture adequate volume  Final   Culture   Final    NO GROWTH 1 DAY Performed at [facility], [address] [place], [place] [address]    Report Status PENDING  Incomplete  Culture, blood (x 2)     Status: None (Preliminary result)   Collection Time: 07/20/17  2:00 AM  Result Value Ref Range Status   Specimen Description BLOOD RIGHT ANTECUBITAL  Final   Special Requests   Final    BOTTLES DRAWN AEROBIC AND ANAEROBIC Blood Culture adequate volume   Culture   Final    NO GROWTH 1 DAY Performed at [facility], [address] [place], [place] [address]    Report Status PENDING  Incomplete  C difficile quick scan w PCR reflex     Status: None   Collection Time: 07/20/17  7:45 PM  Result Value Ref Range Status   C Diff antigen NEGATIVE NEGATIVE Final   C Diff toxin NEGATIVE NEGATIVE Final   C Diff interpretation No C. difficile detected.  Final    Comment: Performed at [facility], [address] [place], [place] [address]      Studies: No results found.  Scheduled Meds: . aspirin EC  325 mg Oral Daily  .  enoxaparin (LOVENOX) injection  75 mg Subcutaneous Q24H  . levalbuterol  1.25 mg Nebulization BID  . methylPREDNISolone (SOLU-MEDROL) injection  60 mg Intravenous Q12H  . oseltamivir  75 mg Oral BID    Continuous Infusions: . sodium chloride 100 mL/hr at 07/21/17 0958  . famotidine (PEPCID) IV Stopped (07/21/17 1059)  . levofloxacin (LEVAQUIN) IV Stopped (07/21/17 1328)     LOS: 1 day     Hollice EspySendil K Quida Glasser, MD [facility]  To reach me or the doctor on call, go to: [email or web address] Password [email or web address]  07/21/2017, 5:01 PM

## 2017-07-22 DIAGNOSIS — R69 Illness, unspecified: Secondary | ICD-10-CM

## 2017-07-22 LAB — GLUCOSE, CAPILLARY: GLUCOSE-CAPILLARY: 109 mg/dL — AB (ref 65–99)

## 2017-07-22 LAB — PROCALCITONIN: PROCALCITONIN: 0.41 ng/mL

## 2017-07-22 MED ORDER — ALBUTEROL SULFATE HFA 108 (90 BASE) MCG/ACT IN AERS: 1.0000 | INHALATION_SPRAY | Freq: Four times a day (QID) | RESPIRATORY_TRACT | 3 refills | Status: AC | PRN

## 2017-07-22 MED ORDER — FAMOTIDINE 20 MG PO TABS
20.0000 mg | ORAL_TABLET | Freq: Two times a day (BID) | ORAL | Status: DC
  Administered 2017-07-22: 20 mg via ORAL
  Filled 2017-07-22: qty 1

## 2017-07-22 MED ORDER — PREDNISONE 10 MG PO TABS: ORAL_TABLET | ORAL | 0 refills | Status: AC

## 2017-07-22 MED ORDER — GUAIFENESIN-DM 100-10 MG/5ML PO SYRP: 5.0000 mL | ORAL_SOLUTION | ORAL | 0 refills | Status: AC | PRN

## 2017-07-22 MED ORDER — ONDANSETRON 4 MG PO TBDP: 4.0000 mg | ORAL_TABLET | Freq: Three times a day (TID) | ORAL | 0 refills | Status: AC | PRN

## 2017-07-22 MED ORDER — OSELTAMIVIR PHOSPHATE 75 MG PO CAPS: 75.0000 mg | ORAL_CAPSULE | Freq: Two times a day (BID) | ORAL | 0 refills | Status: AC

## 2017-07-22 MED ORDER — AZITHROMYCIN 500 MG PO TABS: 500.0000 mg | ORAL_TABLET | Freq: Every day | ORAL | 0 refills | Status: AC

## 2017-07-22 MED ORDER — PREDNISONE 50 MG PO TABS
60.0000 mg | ORAL_TABLET | Freq: Once | ORAL | Status: AC
Start: 2017-07-22 — End: 2017-07-22
  Administered 2017-07-22: 60 mg via ORAL
  Filled 2017-07-22: qty 1

## 2017-07-22 MED ORDER — BENZONATATE 100 MG PO CAPS: 100.0000 mg | ORAL_CAPSULE | Freq: Three times a day (TID) | ORAL | 0 refills | Status: AC | PRN

## 2017-07-22 MED ORDER — FAMOTIDINE 20 MG PO TABS: 20.0000 mg | ORAL_TABLET | Freq: Two times a day (BID) | ORAL | 0 refills | Status: AC

## 2017-07-22 MED ORDER — BENZONATATE 100 MG PO CAPS
100.0000 mg | ORAL_CAPSULE | Freq: Three times a day (TID) | ORAL | Status: DC
  Administered 2017-07-22: 100 mg via ORAL
  Filled 2017-07-22: qty 1

## 2017-07-22 NOTE — Discharge Instructions (Signed)
Your symptoms are most consistent with a viral upper respiratory infection, possibly influenza.  Work up today was reassuring.  The main treatment approach for a viral upper respiratory infection and/or influenza is to treat the symptoms, support your immune system and prevent spread of illness. Stay well-hydrated. Rest. TakeTylenol around the clock to help with associated fevers, sore throat, headaches, generalized body aches and malaise. Use an over-the-counter nasal steroid spray (like Flonase) to help with nasal congestion, runny nose and postnasal drip.  Wash your hands often to prevent spread.  Phenergan can be used orally or rectally to help with nausea. Tamiflu to prevent complications of influenza.   A viral upper respiratory infection and/or influenza typically lasts 5-7 days.  Symptoms resolve slowly.  However, a viral upper respiratory infection can also worsen and progress into pneumonia.  Monitor your symptoms. If your symptoms worsen, persist and you develop persistent fevers, chest pain, productive cough you should follow up with your primary care provider.

## 2017-07-22 NOTE — Care Management Note (Addendum)
Case Management Note  Patient Details  Name: Archie Endoenny M Quinney MRN: [id number] Date of Birth: Sep 02, 1975  Subjective/Objective:  Admitted for Infuenza.               Action/Plan: 07/22/2017 9:45 AM NCM notified Attending Dr. Isidoro Donningai that [facility] does [facility].  Also advised patient has to follow-up with pulmonologist to obtain CPAP machine.  Dr. Isidoro Donningai verbalized understanding and requested that patient be advised of process.  In to speak with patient, NCM advised patient that she would need to follow up with pulmonologist regarding need of [place] in [place] since she is relocating and her insurance can assist her with locating one in the new area by calling the number on the back of her insurance card; they can also assist with PCP.  Patient verbalized understanding.  Prior to admission patient lived at home.  Will be returning to the same living situation after discharge.  At discharge, patient has transportation home.  Patient has the ability to pay for medication/food.  Expected Discharge Date:  07/22/17               Expected Discharge Plan:  Home/Self Care  Discharge planning Services  CM Consult  Status of Service:  Completed, signed off.  Yancey FlemingsKimberly R Becton, RN  Nurse case manager [place] 07/22/2017, 9:46 AM

## 2017-07-22 NOTE — Discharge Summary (Signed)
Physician Discharge Summary   Patient ID: Barbara Andersen MRN: [id number] DOB/AGE: 42/04/1976 42 y.o.  Admit date: 07/19/2017 Discharge date: 07/22/2017  Primary Care Physician:  Patient, No Pcp Per, patient is in the process of relocating to evaluate within a month   Recommendations for Outpatient Follow-up:  1. Follow up with PCP in 1-2 weeks 2. Please obtain BMP/CBC in one week 3. Patient will need outpatient sleep study, recommended to follow-up with PCP and pulmonology in [facility]: None Equipment/Devices: Home O2 evaluation pending prior to discharge  Discharge Condition: stable CODE STATUS: FULL  Diet recommendation: Heart healthy diet   Discharge Diagnoses:    . Sepsis ([place]) . Nausea vomiting and diarrhea . Chest pain . Acute respiratory failure with hypoxia ([place]) . Influenza A . OSA (obstructive sleep apnea) . Morbid obesity ([place])   Consults: None    Allergies:   Allergies  Allergen Reactions  . Aspirin Other (See Comments)    Due to ulcers  . Bee Venom Hives and Swelling  . Fish Allergy Hives and Swelling     DISCHARGE MEDICATIONS: Allergies as of 07/22/2017      Reactions   Aspirin Other (See Comments)   Due to ulcers   Bee Venom Hives, Swelling   Fish Allergy Hives, Swelling      Medication List    TAKE these medications   albuterol 108 (90 Base) MCG/ACT inhaler Commonly known as:  PROVENTIL HFA;VENTOLIN HFA Inhale 1-2 puffs into the lungs every 6 (six) hours as needed for wheezing or shortness of breath.   azithromycin 500 MG tablet Commonly known as:  ZITHROMAX Take 1 tablet (500 mg total) by mouth daily for 5 days.   benzonatate 100 MG capsule Commonly known as:  TESSALON Take 1 capsule (100 mg total) by mouth 3 (three) times daily as needed for cough.   famotidine 20 MG tablet Commonly known as:  PEPCID Take 1 tablet (20 mg total) by mouth 2 (two) times daily.   guaiFENesin-dextromethorphan 100-10 MG/5ML  syrup Commonly known as:  ROBITUSSIN DM Take 5 mLs by mouth every 4 (four) hours as needed for cough.   ondansetron 4 MG disintegrating tablet Commonly known as:  ZOFRAN-ODT Take 1 tablet (4 mg total) by mouth every 8 (eight) hours as needed for nausea or vomiting.   oseltamivir 75 MG capsule Commonly known as:  TAMIFLU Take 1 capsule (75 mg total) by mouth 2 (two) times daily for 3 days.   predniSONE 10 MG tablet Commonly known as:  DELTASONE Prednisone dosing: Take  Prednisone '40mg'$  (4 tabs) x 2 days, then taper to '30mg'$  (3 tabs) x 2 days, then '20mg'$  (2 tabs) x 2 days, then '10mg'$  (1 tab) x 2days, then OFF.            Durable Medical Equipment  (From admission, onward)        Start     Ordered   07/22/17 0756  For home use only DME continuous positive airway pressure (CPAP)  Once    Question Answer Comment  Patient has OSA or probable OSA Yes   Is the patient currently using CPAP in the home No   If no (to question two) date of sleep study patient had CPAP at home but had to return it due to insurance issues   Settings [employer]   CPAP supplies needed Mask, headgear, cushions, filters, heated tubing and water chamber      07/22/17 0756  Brief H and P: For complete details please refer to admission H and P, but in brief 42 year old female past history of obesity and obstructive sleep apnea who presented to the emergency room with complaints of several days of progressively worsening shortness of breath, dyspnea on exertion and nausea vomiting diarrhea. Found to have influenza A as well as acute respiratory failure with hypoxia. Started on Tamiflu, nebulizers, oxygen.    Hospital Course:     Influenza A with acute hypoxic respiratory failure, bronchitis -Much improving, no fevers or chills overnight, -Continue Tamiflu to complete full course of treatment, continue nebulizers as needed, antitussives -Continue Zithromax. -Home O2 evaluation prior to  discharge. -Patient will also need outpatient sleep study -Patient met sepsis criteria at the time of admission with tachycardia 105-130's, tachypnea respiratory rate 28, influenza panel positive, temp of 103, hypotension Influenza panel was positive for influenza A, HIV negative, blood cultures negative to date     Nausea vomiting and diarrhea -Resolved, likely due to influenza a -C. difficile negative, influenza panel positive for flu a, blood cultures negative     Chest pain, atypical - Likely due to bronchospasm from #1, improving     OSA (obstructive sleep apnea) -Per her insurance, she will need outpatient sleep study, patient is in process of relocating to [facility], recommended for outpatient follow-up with pulmonology in [place]     Morbid obesity ([place]) -BMI 48.9, counseled strongly on diet and weight control   Day of Discharge S: Feeling better, no fevers, wants to go home  BP (!) 98/41 (BP Location: Right Arm)   Pulse 65   Temp 98.9 F (37.2 C) (Oral)   Resp 18   Ht '5\' 8"'$  (1.727 m)   Wt (!) 146.1 kg (322 lb)   LMP 07/19/2017   SpO2 93%   BMI 48.96 kg/m   Physical Exam: General: Alert and awake oriented x3 not in any acute distress. HEENT: anicteric sclera, pupils reactive to light and accommodation CVS: S1-S2 clear no murmur rubs or gallops Chest: Mild scattered rhonchi improving Abdomen: soft nontender, nondistended, normal bowel sounds Extremities: no cyanosis, clubbing or edema noted bilaterally Neuro: Cranial nerves II-XII intact, no focal neurological deficits   The results of significant diagnostics from this hospitalization (including imaging, microbiology, ancillary and laboratory) are listed below for reference.      Procedures/Studies:  Dg Chest 2 View  Result Date: 07/19/2017 CLINICAL DATA:  Body aches.  Vomiting and diarrhea for 2 weeks. EXAM: CHEST - 2 VIEW COMPARISON:  Chest CT, 03/20/2014 FINDINGS: The heart size and mediastinal contours  are within normal limits. Both lungs are clear. No pleural effusion or pneumothorax. The visualized skeletal structures are unremarkable. IMPRESSION: No active cardiopulmonary disease. Electronically Signed   By: Lajean Manes M.D.   On: 07/19/2017 20:21   Ct Angio Chest Pe W Or Wo Contrast  Result Date: 07/20/2017 CLINICAL DATA:  42 year old female with shortness of breath. EXAM: CT ANGIOGRAPHY CHEST WITH CONTRAST TECHNIQUE: Multidetector CT imaging of the chest was performed using the standard protocol during bolus administration of intravenous contrast. Multiplanar CT image reconstructions and MIPs were obtained to evaluate the vascular anatomy. CONTRAST:  163m ISOVUE-370 IOPAMIDOL (ISOVUE-370) INJECTION 76% COMPARISON:  Chest radiograph dated 07/19/2017 and CT dated 03/20/2014 FINDINGS: Cardiovascular: Borderline cardiomegaly. No pericardial effusion. The thoracic aorta is unremarkable. Evaluation of the pulmonary arteries is limited due to respiratory motion artifact and suboptimal opacification of the peripheral branches. No definite large or central pulmonary artery embolus identified. Mediastinum/Nodes:  Right hilar adenopathy measures 12 mm in short axis. Top-normal left hilar lymph nodes. No mediastinal adenopathy. Esophagus is grossly unremarkable. Lungs/Pleura: Streaky and linear densities predominantly involving the lung bases most likely atelectasis. There is hazy airspace densities, likely atelectatic changes. No focal consolidation, pleural effusion, or pneumothorax. The central airways are patent. Upper Abdomen: Probable fatty infiltration of the liver. The visualized upper abdomen is otherwise unremarkable. Musculoskeletal: No chest wall abnormality. No acute or significant osseous findings. Review of the MIP images confirms the above findings. IMPRESSION: 1. No large or central pulmonary artery embolus. Evaluation of the peripheral pulmonary arteries is limited due to respiratory motion  artifact. 2. Atelectatic changes of the lungs.  No focal consolidation. 3. Mild right hilar adenopathy of indeterminate etiology or clinical significance. Clinical correlation is recommended. Electronically Signed   By: Anner Crete M.D.   On: 07/20/2017 03:03      LAB RESULTS: Basic Metabolic Panel: Recent Labs  Lab 07/20/17 0059 07/21/17 0435  NA 135 138  K 3.3* 4.2  CL 105 108  CO2 17* 22  GLUCOSE 136* 151*  BUN 7 10  CREATININE 0.76 0.67  CALCIUM 8.2* 8.4*   Liver Function Tests: Recent Labs  Lab 07/19/17 1913  AST 29  ALT 33  ALKPHOS 67  BILITOT 0.3  PROT 7.5  ALBUMIN 3.7   Recent Labs  Lab 07/19/17 1913  LIPASE 23   No results for input(s): AMMONIA in the last 168 hours. CBC: Recent Labs  Lab 07/19/17 1913 07/20/17 0059  WBC 8.6 7.9  HGB 12.8 13.1  HCT 41.0 41.2  MCV 80.4 80.9  PLT 175 153   Cardiac Enzymes: Recent Labs  Lab 07/20/17 0057 07/20/17 1438  TROPONINI <0.03 <0.03   BNP: Invalid input(s): POCBNP CBG: Recent Labs  Lab 07/21/17 0724 07/22/17 0730  GLUCAP 134* 109*      Disposition and Follow-up: Discharge Instructions    Diet - low sodium heart healthy   Complete by:  As directed    Increase activity slowly   Complete by:  As directed        DISPOSITION: [facility]. Schedule an appointment as soon as possible for a visit in 2 weeks.   Why:  Please call office on Mondays only to schedule a hospital follow up appointment. Thank you.  Contact information: [address] [place] [address] [phone]           Time coordinating discharge:  35 minutes  Signed:   Estill Cotta M.D. [facility] 07/22/2017, 10:37 AM Pager: [phone]

## 2017-07-22 NOTE — Progress Notes (Signed)
SATURATION QUALIFICATIONS: (This note is used to comply with regulatory documentation for home oxygen)  Patient Saturations on Room Air at Rest =97%  Patient Saturations on Room Air while Ambulating =  91%%  Patient Saturations on  N/A Liters of oxygen while Ambulating =  N/A%  Please briefly explain why patient needs home oxygen:

## 2017-07-25 LAB — CULTURE, BLOOD (ROUTINE X 2)
Culture: NO GROWTH
Culture: NO GROWTH
SPECIAL REQUESTS: ADEQUATE
Special Requests: ADEQUATE

## 2018-02-22 ENCOUNTER — Other Ambulatory Visit: Payer: Self-pay

## 2018-03-01 LAB — CYTOLOGY - PAP: DIAGNOSIS: NEGATIVE

## 2018-10-11 ENCOUNTER — Encounter: Payer: Self-pay | Admitting: Emergency Medicine

## 2018-10-11 ENCOUNTER — Emergency Department
Admission: EM | Admit: 2018-10-11 | Discharge: 2018-10-11 | Disposition: A | Discharge status: Home / Self Care | Payer: PRIVATE HEALTH INSURANCE | Attending: Emergency Medicine | Admitting: Emergency Medicine

## 2018-10-11 ENCOUNTER — Other Ambulatory Visit: Payer: Self-pay

## 2018-10-11 ENCOUNTER — Emergency Department: Payer: PRIVATE HEALTH INSURANCE

## 2018-10-11 DIAGNOSIS — W228XXA Striking against or struck by other objects, initial encounter: Secondary | ICD-10-CM | POA: Insufficient documentation

## 2018-10-11 DIAGNOSIS — S9032XA Contusion of left foot, initial encounter: Secondary | ICD-10-CM | POA: Diagnosis not present

## 2018-10-11 DIAGNOSIS — Y9389 Activity, other specified: Secondary | ICD-10-CM | POA: Insufficient documentation

## 2018-10-11 DIAGNOSIS — Y9289 Other specified places as the place of occurrence of the external cause: Secondary | ICD-10-CM | POA: Insufficient documentation

## 2018-10-11 DIAGNOSIS — Z79899 Other long term (current) drug therapy: Secondary | ICD-10-CM | POA: Insufficient documentation

## 2018-10-11 DIAGNOSIS — F319 Bipolar disorder, unspecified: Secondary | ICD-10-CM | POA: Insufficient documentation

## 2018-10-11 DIAGNOSIS — Y998 Other external cause status: Secondary | ICD-10-CM | POA: Diagnosis not present

## 2018-10-11 DIAGNOSIS — S99922A Unspecified injury of left foot, initial encounter: Secondary | ICD-10-CM | POA: Diagnosis present

## 2018-10-11 MED ORDER — MELOXICAM 15 MG PO TABS
15.0000 mg | ORAL_TABLET | Freq: Every day | ORAL | 0 refills | Status: AC
Start: 2018-10-11 — End: ?

## 2018-10-11 MED ORDER — TRAMADOL HCL 50 MG PO TABS: 50.0000 mg | ORAL_TABLET | Freq: Four times a day (QID) | ORAL | 0 refills | Status: AC | PRN

## 2018-10-11 MED ORDER — HYDROCODONE-ACETAMINOPHEN 5-325 MG PO TABS
1.0000 | ORAL_TABLET | Freq: Once | ORAL | Status: AC
  Administered 2018-10-11: 1 via ORAL
  Filled 2018-10-11: qty 1

## 2018-10-11 NOTE — ED Triage Notes (Signed)
Pt reports she tripped over board today leaving her with left foot and toe pain. 3rd digit and great toe painful to the touch with swelling noted. Pt denies dizziness or LOC.

## 2018-10-11 NOTE — ED Provider Notes (Signed)
[facility] Emergency Department Provider Note  ____________________________________________  Time seen: Approximately 9:16 PM  I have reviewed the triage vital signs and the nursing notes.   HISTORY  Chief Complaint Fall    HPI Barbara Andersen is a 43 y.o. female who presents the emergency department complaining of pain to the great toe and third digit of the left foot.  Patient reports that she accidentally tripped over a board this evening and has had significant pain to both digits since this occurred.  Patient is having moderate pain to the metatarsal area of the third digit.  No other injury or complaint.  No medications for his complaint prior to arrival.         Past Medical History:  Diagnosis Date  . Bipolar 1 disorder (HCC)   . Depression   . Disc degeneration   . GERD (gastroesophageal reflux disease)   . Sleep apnea   . Ulcer disease     Patient Active Problem List   Diagnosis Date Noted  . Sepsis (HCC) 07/20/2017  . Nausea vomiting and diarrhea 07/20/2017  . Chest pain 07/20/2017  . Acute respiratory failure with hypoxia (HCC) 07/20/2017  . Influenza A 07/20/2017  . OSA (obstructive sleep apnea) 07/20/2017  . Morbid obesity (HCC) 07/20/2017  . H/O sleep apnea   . Ankle fracture, left 11/04/2012    Past Surgical History:  Procedure Laterality Date  . CESAREAN SECTION    . DILATION AND CURETTAGE OF UTERUS     x 2  . KIDNEY STONE SURGERY    . ORIF ANKLE FRACTURE Left 11/04/2012   Dr Ophelia Charter  . ORIF FIBULA FRACTURE Left 11/04/2012   Procedure: OPEN REDUCTION INTERNAL FIXATION (ORIF) FIBULA FRACTURE;  Surgeon: Eldred Manges, MD;  Location: [facility] OR;  Service: Orthopedics;  Laterality: Left;  Open Reduction Internal Fixation left Fibula and Syndesmotic Screw  . TUBAL LIGATION      Prior to Admission medications   Medication Sig Start Date End Date Taking? Authorizing Provider  albuterol (PROVENTIL HFA;VENTOLIN HFA) 108 (90 Base)  MCG/ACT inhaler Inhale 1-2 puffs into the lungs every 6 (six) hours as needed for wheezing or shortness of breath. 07/22/17   Rai, Delene Ruffini, MD  benzonatate (TESSALON) 100 MG capsule Take 1 capsule (100 mg total) by mouth 3 (three) times daily as needed for cough. 07/22/17   Rai, Delene Ruffini, MD  famotidine (PEPCID) 20 MG tablet Take 1 tablet (20 mg total) by mouth 2 (two) times daily. 07/22/17   Rai, Ripudeep K, MD  guaiFENesin-dextromethorphan (ROBITUSSIN DM) 100-10 MG/5ML syrup Take 5 mLs by mouth every 4 (four) hours as needed for cough. 07/22/17   Rai, Ripudeep Kirtland Bouchard, MD  meloxicam (MOBIC) 15 MG tablet Take 1 tablet (15 mg total) by mouth daily. 10/11/18   Dalicia Kisner, Delorise Royals, PA-C  ondansetron (ZOFRAN-ODT) 4 MG disintegrating tablet Take 1 tablet (4 mg total) by mouth every 8 (eight) hours as needed for nausea or vomiting. 07/22/17   Rai, Delene Ruffini, MD  predniSONE (DELTASONE) 10 MG tablet Prednisone dosing: Take  Prednisone 40mg  (4 tabs) x 2 days, then taper to 30mg  (3 tabs) x 2 days, then 20mg  (2 tabs) x 2 days, then 10mg  (1 tab) x 2days, then OFF. 07/22/17   Rai, Ripudeep K, MD  traMADol (ULTRAM) 50 MG tablet Take 1 tablet (50 mg total) by mouth every 6 (six) hours as needed. 10/11/18   Courtnie Brenes, Delorise Royals, PA-C    Allergies Aspirin; Bee venom;  and Fish allergy  History reviewed. No pertinent family history.  Social History Social History   Tobacco Use  . Smoking status: Never Smoker  . Smokeless tobacco: Never Used  Substance Use Topics  . Alcohol use: No  . Drug use: No     Review of Systems  Constitutional: No fever/chills Eyes: No visual changes. No discharge ENT: No upper respiratory complaints. Cardiovascular: no chest pain. Respiratory: no cough. No SOB. Gastrointestinal: No abdominal pain.  No nausea, no vomiting.   Musculoskeletal: Positive for pain and injury to the first and third digit of the left foot. Skin: Negative for rash, abrasions, lacerations,  ecchymosis. Neurological: Negative for headaches, focal weakness or numbness. 10-point ROS otherwise negative.  ____________________________________________   PHYSICAL EXAM:  VITAL SIGNS: ED Triage Vitals [10/11/18 2051]  Enc Vitals Group     BP 124/88     Pulse Rate 89     Resp 18     Temp 98 F (36.7 C)     Temp Source Oral     SpO2 99 %     Weight (!) 315 lb (142.9 kg)     Height  (1.727 m)     Head Circumference      Peak Flow      Pain Score      Pain Loc      Pain Edu?      Excl. in GC?      Constitutional: Alert and oriented. Well appearing and in no acute distress. Eyes: Conjunctivae are normal. PERRL. EOMI. Head: Atraumatic. Neck: No stridor.    Cardiovascular: Normal rate, regular rhythm. Normal S1 and S2.  Good peripheral circulation. Respiratory: Normal respiratory effort without tachypnea or retractions. Lungs CTAB. Good air entry to the bases with no decreased or absent breath sounds. Musculoskeletal: Full range of motion to all extremities. No gross deformities appreciated. Neurologic:  Normal speech and language. No gross focal neurologic deficits are appreciated.  Skin:  Skin is warm, dry and intact. No rash noted. Psychiatric: Mood and affect are normal. Speech and behavior are normal. Patient exhibits appropriate insight and judgement.   ____________________________________________   LABS (all labs ordered are listed, but only abnormal results are displayed)  Labs Reviewed - No data to display ____________________________________________  EKG   ____________________________________________  RADIOLOGY I personally viewed and evaluated these images as part of my medical decision making, as well as reviewing the written report by the radiologist.  Dg Foot Complete Left  Result Date: 10/11/2018 CLINICAL DATA:  Left foot pain after fall. EXAM: LEFT FOOT - COMPLETE 3+ VIEW COMPARISON:  Radiographs of November 30, 2016. FINDINGS: There is no  evidence of fracture or dislocation. There is no evidence of arthropathy. Mild posterior calcaneal spurring is noted. Soft tissues are unremarkable. IMPRESSION: No acute abnormality seen in the left foot. Electronically Signed   By: Lupita Raider M.D.   On: 10/11/2018 21:53    ____________________________________________    PROCEDURES  Procedure(s) performed:    .Splint Application Date/Time: 10/11/2018 10:10 PM Performed by: Racheal Patches, PA-C Authorized by: Racheal Patches, PA-C   Consent:    Consent obtained:  Verbal   Consent given by:  Patient   Risks discussed:  Pain and swelling Pre-procedure details:    Sensation:  Normal Procedure details:    Laterality:  Left   Location:  Foot   Foot:  L foot   Splint type: post-op shoe. Post-procedure details:    Pain:  Unchanged  Sensation:  Normal   Patient tolerance of procedure:  Tolerated well, no immediate complications      Medications  HYDROcodone-acetaminophen (NORCO/VICODIN) 5-325 MG per tablet 1 tablet (has no administration in time range)     ____________________________________________   INITIAL IMPRESSION / ASSESSMENT AND PLAN / ED COURSE  Pertinent labs & imaging results that were available during my care of the patient were reviewed by me and considered in my medical decision making (see chart for details).  Review of the [place] CSRS was performed in accordance of the [facility] prior to dispensing any controlled drugs.           Patient's diagnosis is consistent with foot contusion.  Patient presented to the emergency department with pain to the great toe and third digit.  On exam, mild edema to the third digit is appreciated.  No deformities.  X-ray reveals no acute osseous abnormality to the left foot.. Patient will be discharged home with prescriptions for Motrin and meloxicam. Patient is to follow up with primary care as needed or otherwise directed. Patient is given ED precautions to  return to the ED for any worsening or new symptoms.     ____________________________________________  FINAL CLINICAL IMPRESSION(S) / ED DIAGNOSES  Final diagnoses:  Contusion of left foot, initial encounter      NEW MEDICATIONS STARTED DURING THIS VISIT:  ED Discharge Orders         Ordered    traMADol (ULTRAM) 50 MG tablet  Every 6 hours PRN     10/11/18 2210    meloxicam (MOBIC) 15 MG tablet  Daily     10/11/18 2210              This chart was dictated using voice recognition software/Dragon. Despite best efforts to proofread, errors can occur which can change the meaning. Any change was purely unintentional.    Racheal PatchesCuthriell, Alyia Lacerte D, PA-C 10/11/18 2211    Dionne BucySiadecki, Sebastian, MD 10/11/18 2231

## 2019-01-11 DEATH — deceased

## 2019-08-28 IMAGING — CT CT ANGIO CHEST
2 of 7 series · 18 of 36 positions shown · IV contrast (Omni 300)
Comparison: Chest radiograph dated 07/19/2017 and CT dated
03/20/2014

CLINICAL DATA: 41-year-old female with shortness of breath.

EXAM:
CT ANGIOGRAPHY CHEST WITH CONTRAST
TECHNIQUE: Multidetector CT imaging of the chest was performed using the
standard protocol during bolus administration of intravenous
contrast. Multiplanar CT image reconstructions and MIPs were
obtained to evaluate the vascular anatomy.
CONTRAST:  100mL ZWVOEE-TUE IOPAMIDOL (ZWVOEE-TUE) INJECTION 76%

[Series 8: pe thins · axial · 0.89mm/px · z∈[-282,-46]mm · 17 of 381 slices shown]
[im 22/381  lung]
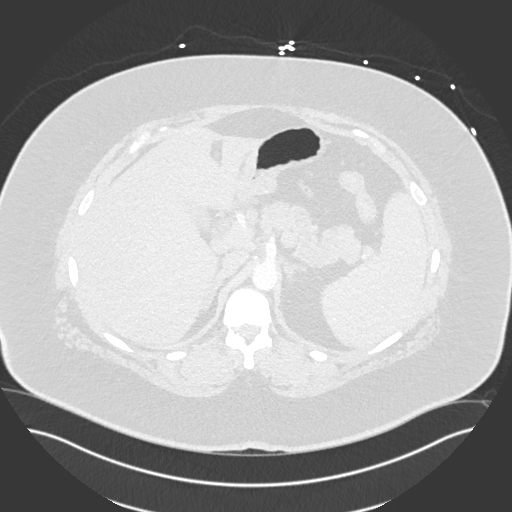
[im 43/381  mediastinal]
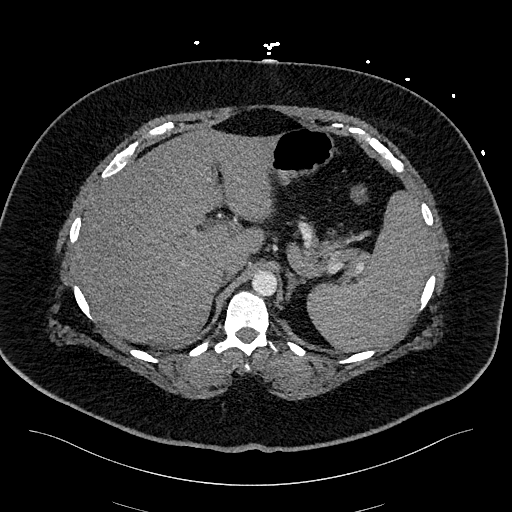
[im 64/381  lung]
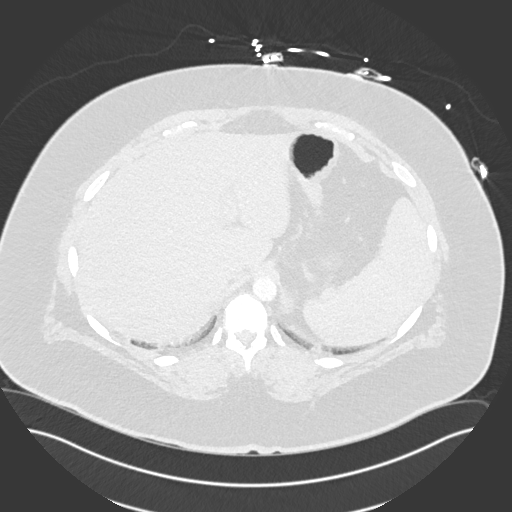
[im 85/381  mediastinal]
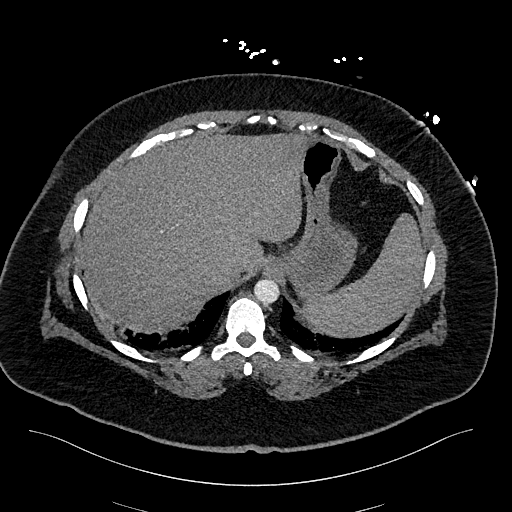
[im 106/381  lung]
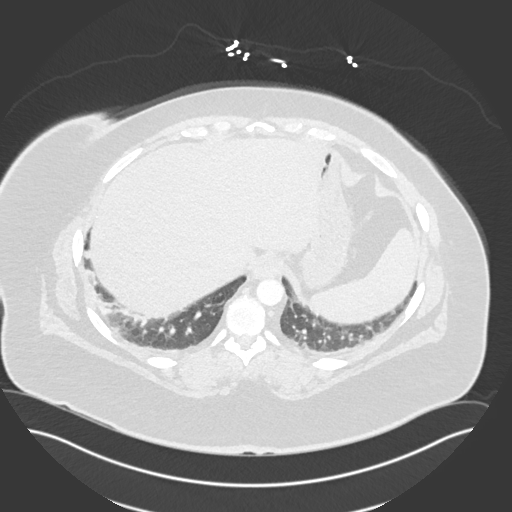
[im 127/381  mediastinal]
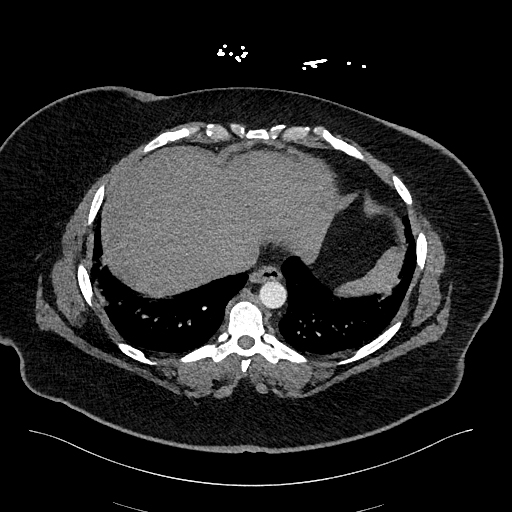
[im 148/381  lung]
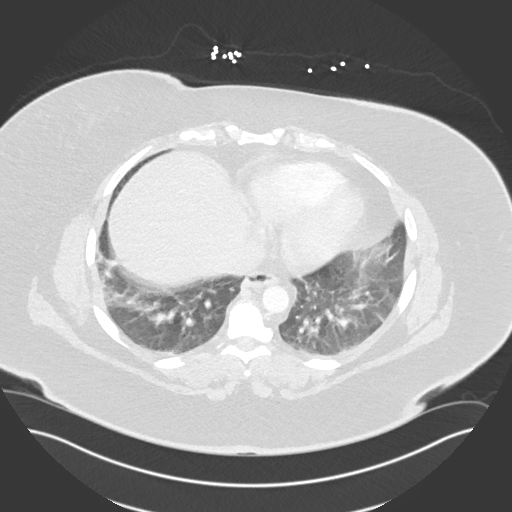
[im 169/381  mediastinal]
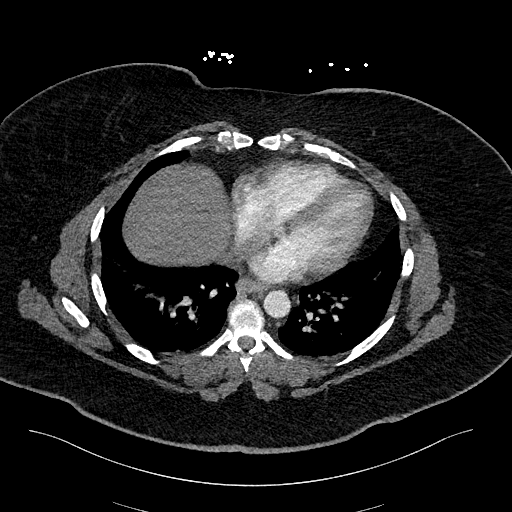
[im 191/381  lung]
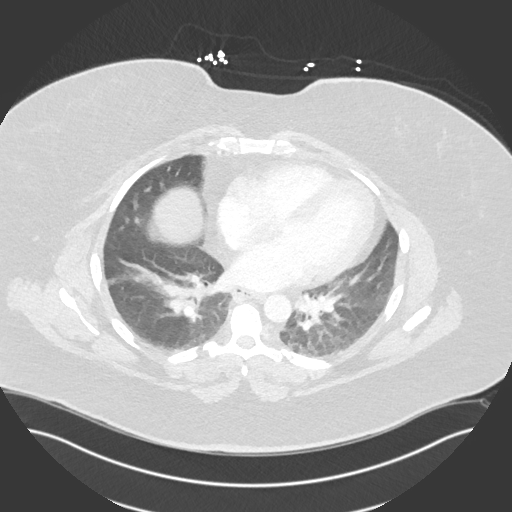
[im 212/381  mediastinal]
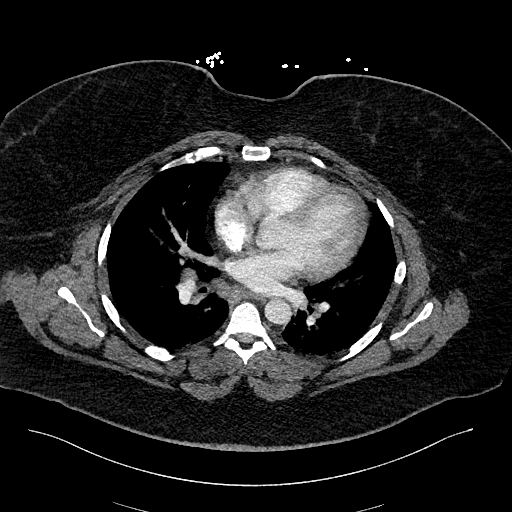
[im 233/381  lung]
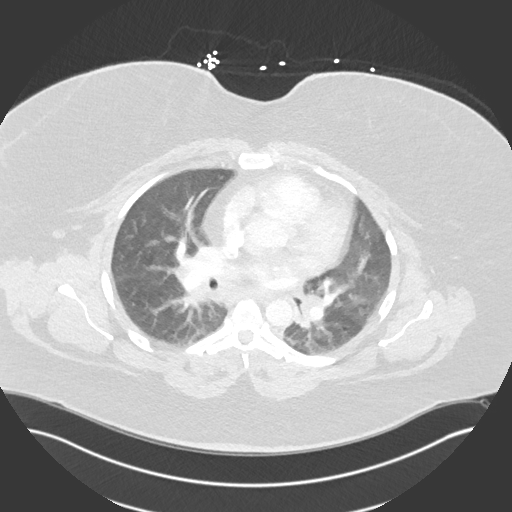
[im 254/381  mediastinal]
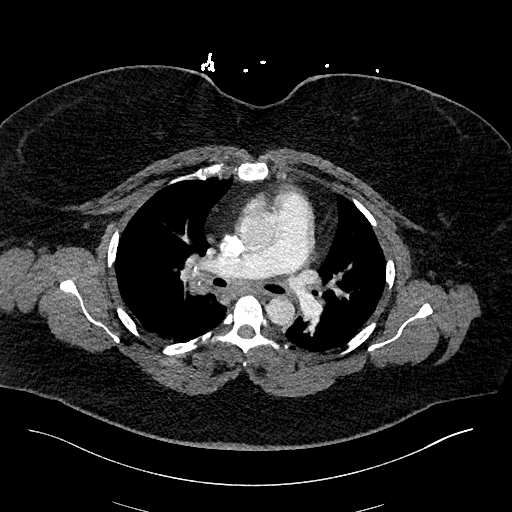
[im 275/381  lung]
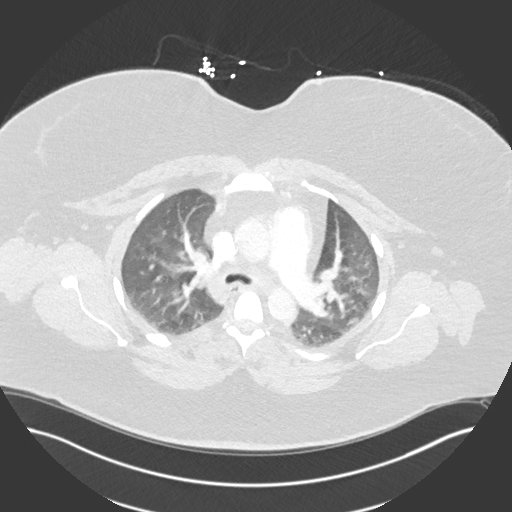
[im 296/381  mediastinal]
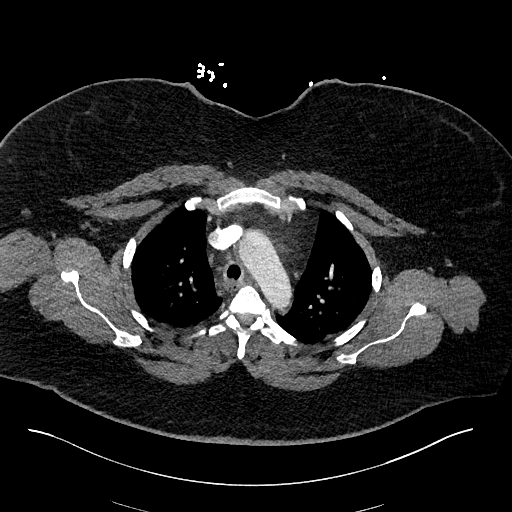
[im 317/381  lung]
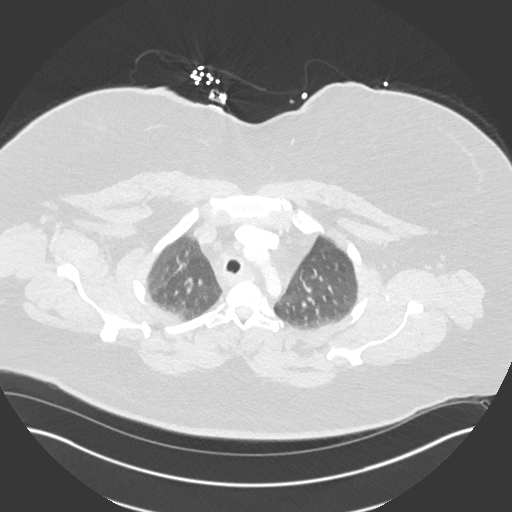
[im 338/381  mediastinal]
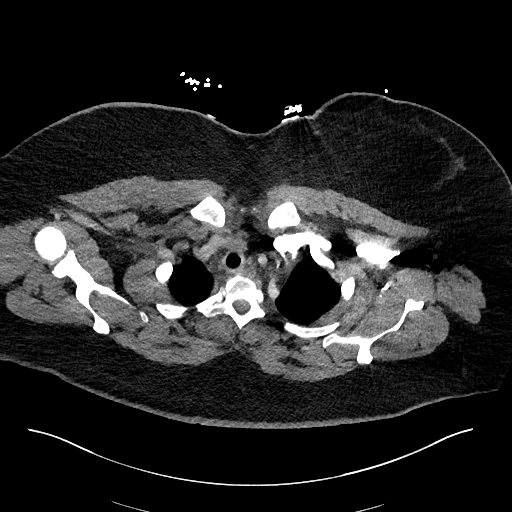
[im 359/381  lung]
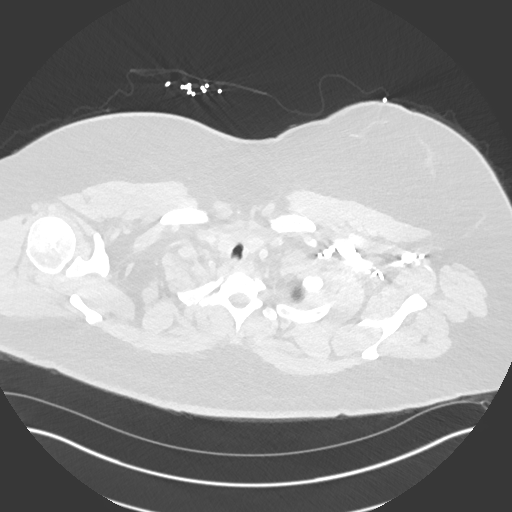

[Series 10: pe 2mm cor · coronal · 0.53mm/px · 1 of 88 slices shown]
[im 44/88  mediastinal]
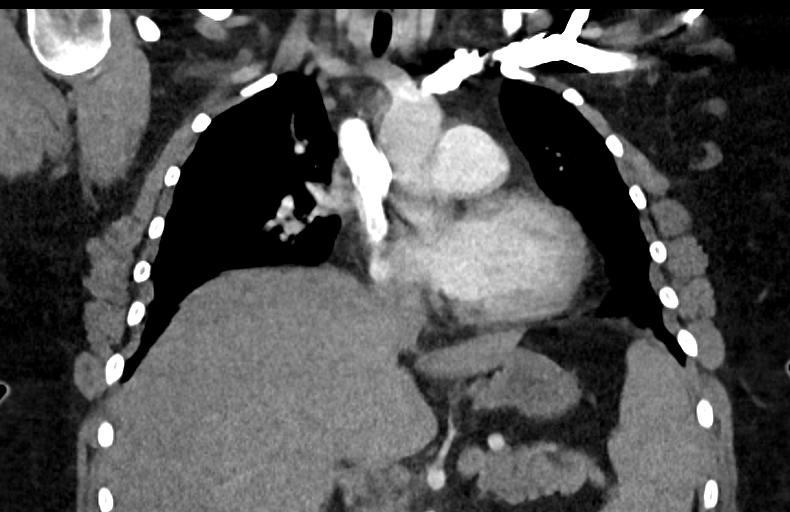

[18 of 36 positions shown; findings below may reference images not displayed]

FINDINGS: Cardiovascular: Borderline cardiomegaly. No pericardial effusion.
The thoracic aorta is unremarkable. Evaluation of the pulmonary
arteries is limited due to respiratory motion artifact and
suboptimal opacification of the peripheral branches. No definite
large or central pulmonary artery embolus identified.

Mediastinum/Nodes: Right hilar adenopathy measures 12 mm in short
axis. Top-normal left hilar lymph nodes. No mediastinal adenopathy.
Esophagus is grossly unremarkable.

Lungs/Pleura: Streaky and linear densities predominantly involving
the lung bases most likely atelectasis. There is hazy airspace
densities, likely atelectatic changes. No focal consolidation,
pleural effusion, or pneumothorax. The central airways are patent.

Upper Abdomen: Probable fatty infiltration of the liver. The
visualized upper abdomen is otherwise unremarkable.

Musculoskeletal: No chest wall abnormality. No acute or significant
osseous findings.

Review of the MIP images confirms the above findings.
IMPRESSION: 1. No large or central pulmonary artery embolus. Evaluation of the
peripheral pulmonary arteries is limited due to respiratory motion
artifact.
2. Atelectatic changes of the lungs.  No focal consolidation.
3. Mild right hilar adenopathy of indeterminate etiology or clinical
significance. Clinical correlation is recommended.

## 2020-11-18 IMAGING — DX LEFT FOOT - COMPLETE 3+ VIEW
3 series · 3 of 3 positions shown · non-contrast
Comparison: Radiographs November 30, 2016.

CLINICAL DATA: Left foot pain after fall.

EXAM:
LEFT FOOT - COMPLETE 3+ VIEW

[foot ap]
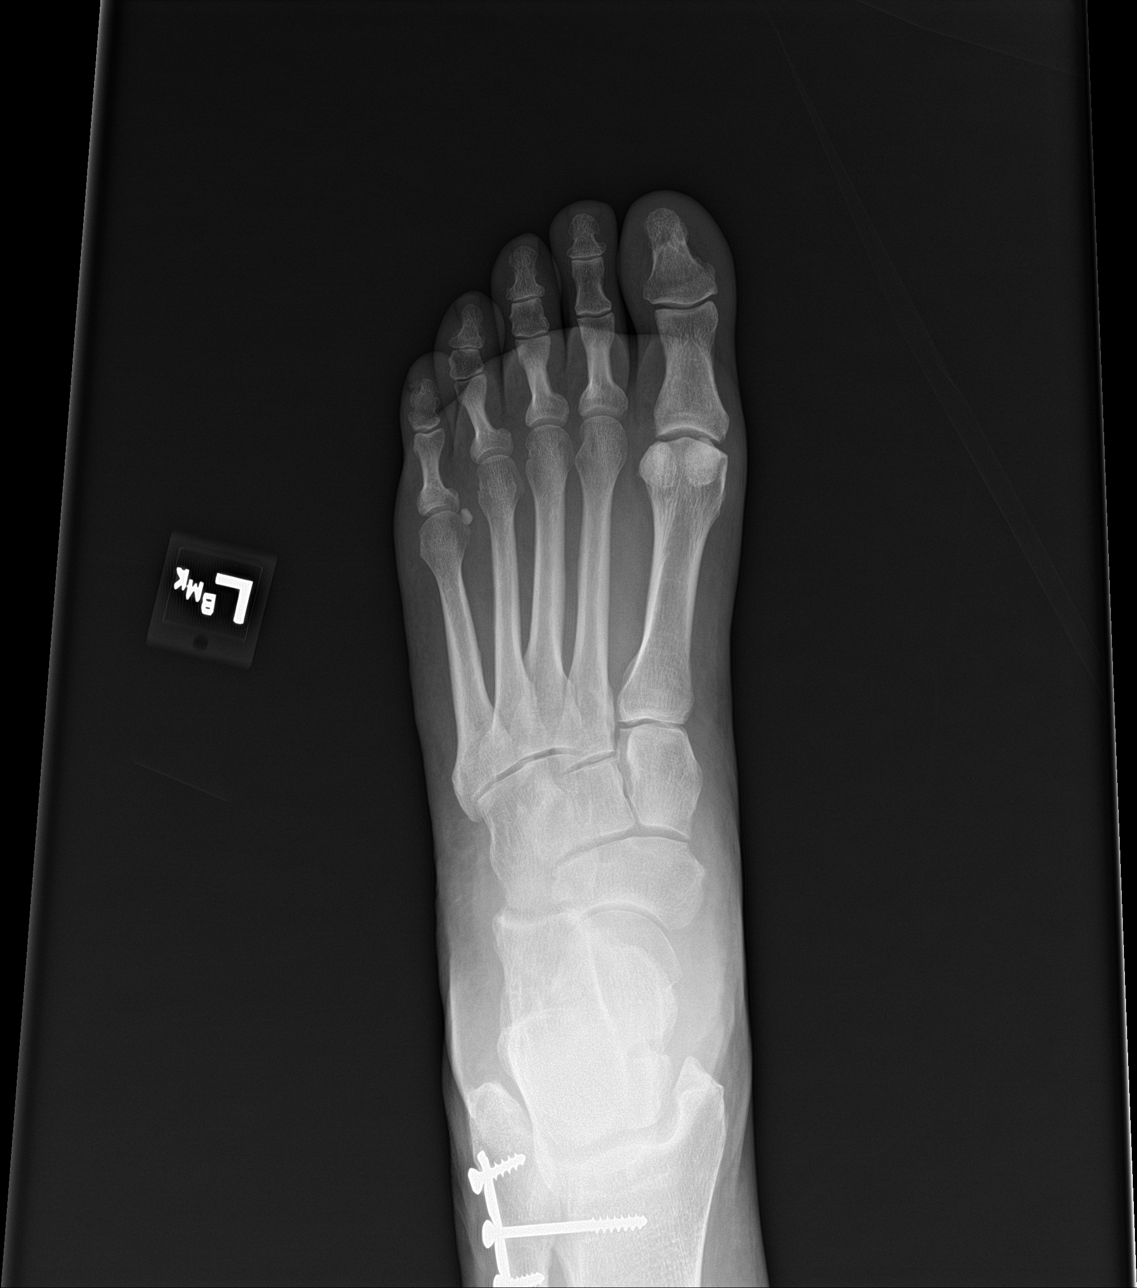

[foot obl]
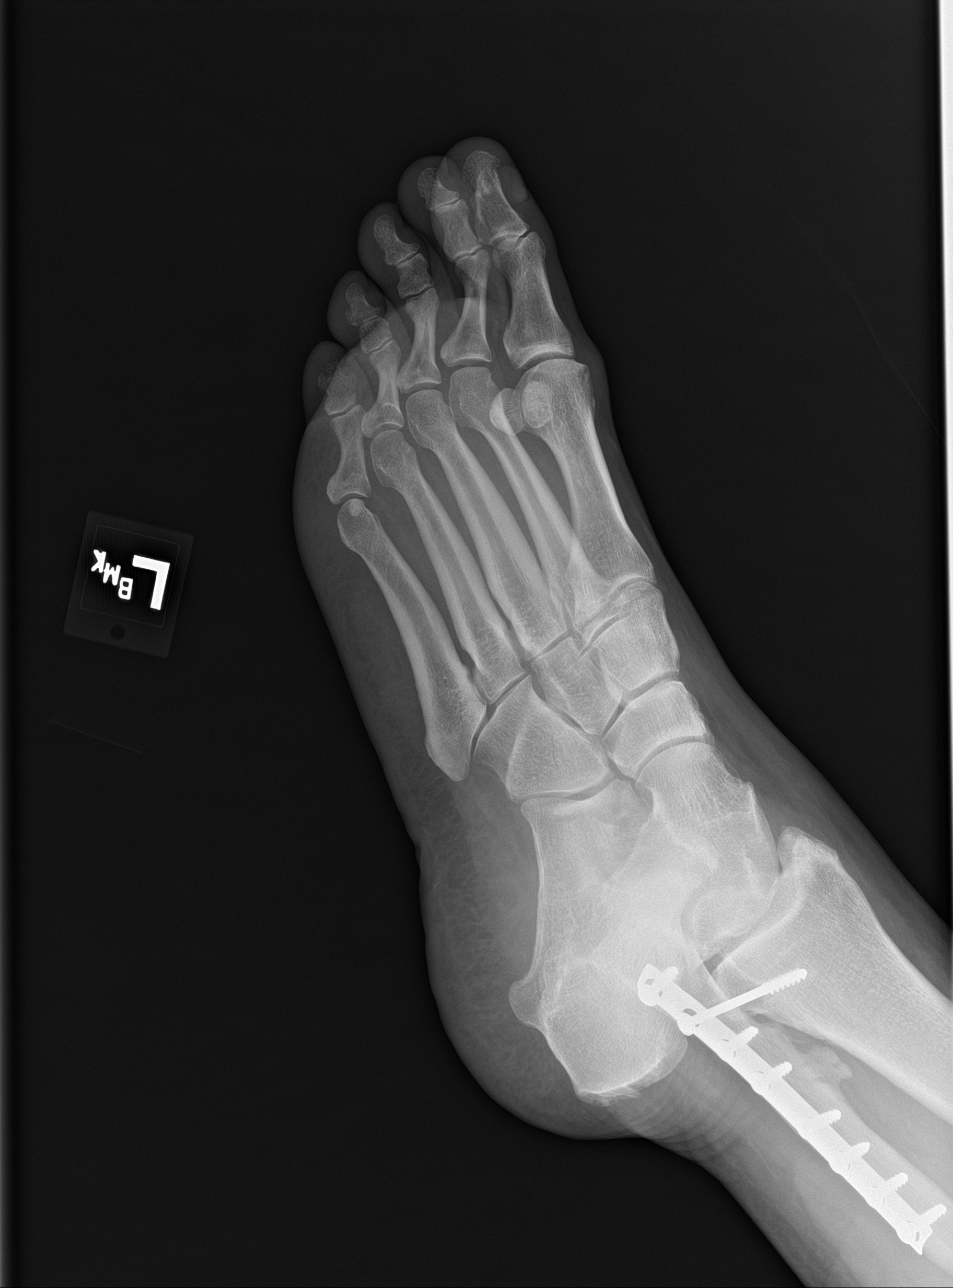

[foot lat]
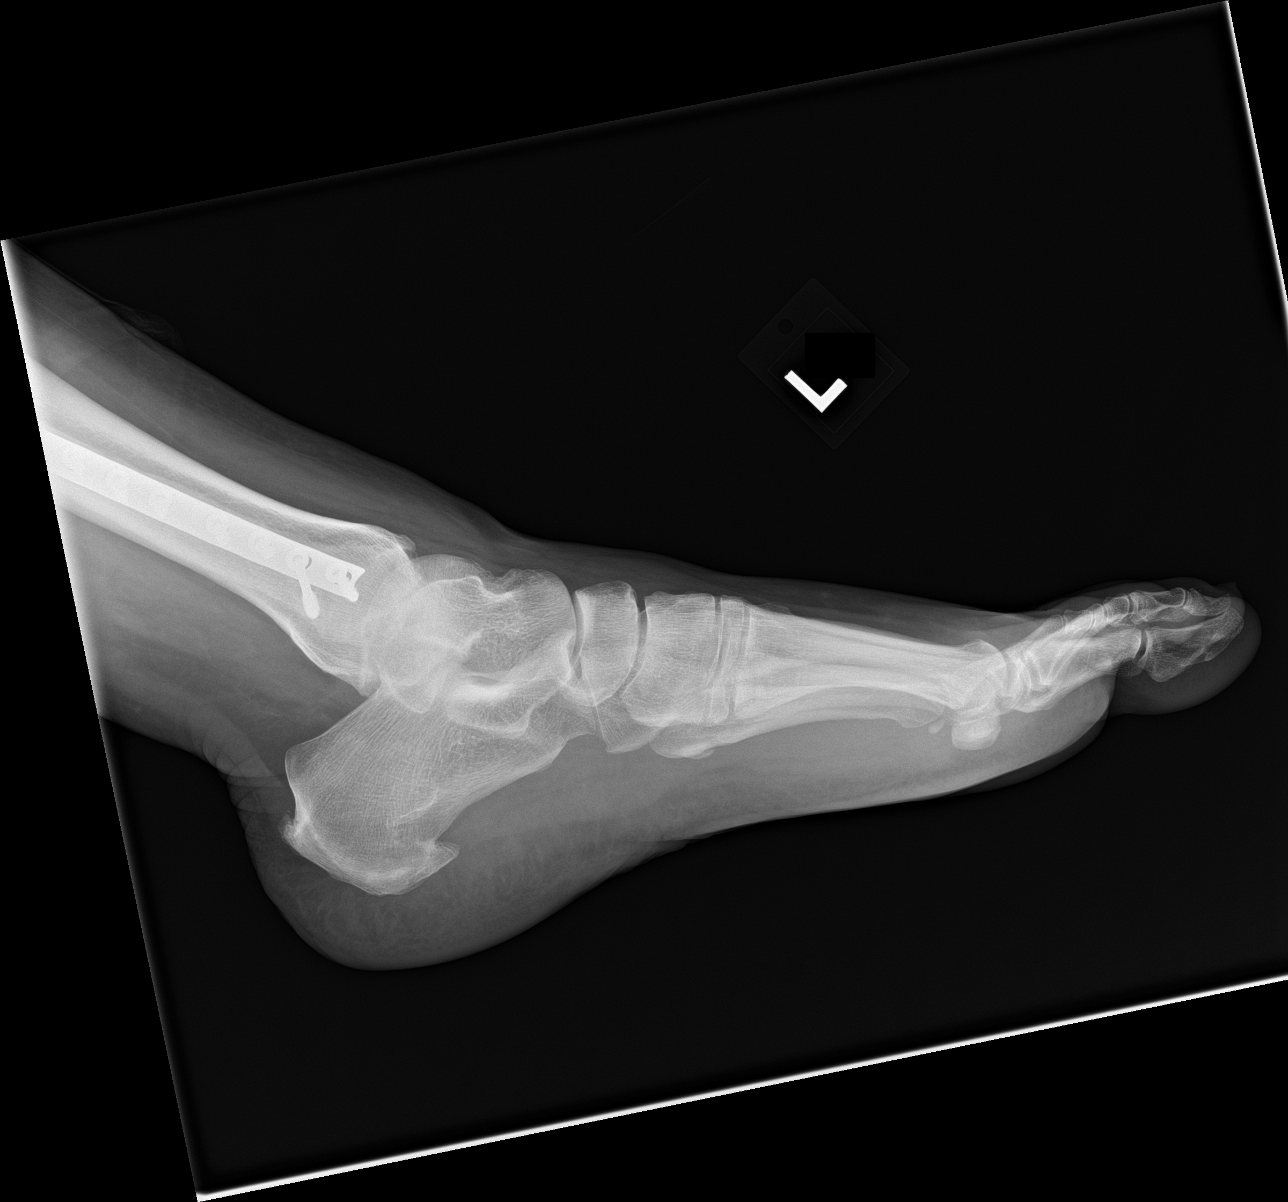

[3 of 3 positions shown; findings below may reference images not displayed]

FINDINGS: There is no evidence of fracture or dislocation. There is no
evidence of arthropathy. Mild posterior calcaneal spurring is noted.
Soft tissues are unremarkable.
IMPRESSION: No acute abnormality seen in the left foot.
